# Patient Record
Sex: Female | Born: 1965 | Race: White | Hispanic: No | Marital: Married | Smoking: Never smoker
Health system: Southern US, Community
[De-identification: ages and names within clinical notes are randomized; demographics above are authoritative.]

## PROBLEM LIST (undated history)

## (undated) DIAGNOSIS — H8109 Meniere's disease, unspecified ear: Secondary | ICD-10-CM

## (undated) DIAGNOSIS — E7800 Pure hypercholesterolemia, unspecified: Secondary | ICD-10-CM

## (undated) DIAGNOSIS — K589 Irritable bowel syndrome without diarrhea: Secondary | ICD-10-CM

## (undated) DIAGNOSIS — T4145XA Adverse effect of unspecified anesthetic, initial encounter: Secondary | ICD-10-CM

## (undated) DIAGNOSIS — K5790 Diverticulosis of intestine, part unspecified, without perforation or abscess without bleeding: Secondary | ICD-10-CM

## (undated) DIAGNOSIS — J309 Allergic rhinitis, unspecified: Secondary | ICD-10-CM

## (undated) DIAGNOSIS — K219 Gastro-esophageal reflux disease without esophagitis: Secondary | ICD-10-CM

## (undated) DIAGNOSIS — N951 Menopausal and female climacteric states: Secondary | ICD-10-CM

## (undated) DIAGNOSIS — R112 Nausea with vomiting, unspecified: Secondary | ICD-10-CM

## (undated) DIAGNOSIS — T8859XA Other complications of anesthesia, initial encounter: Secondary | ICD-10-CM

## (undated) DIAGNOSIS — I499 Cardiac arrhythmia, unspecified: Secondary | ICD-10-CM

## (undated) DIAGNOSIS — Z9889 Other specified postprocedural states: Secondary | ICD-10-CM

## (undated) DIAGNOSIS — Z8719 Personal history of other diseases of the digestive system: Secondary | ICD-10-CM

## (undated) DIAGNOSIS — J45909 Unspecified asthma, uncomplicated: Secondary | ICD-10-CM

## (undated) DIAGNOSIS — M1990 Unspecified osteoarthritis, unspecified site: Secondary | ICD-10-CM

## (undated) HISTORY — DX: Allergic rhinitis, unspecified: J30.9

## (undated) HISTORY — PX: ESOPHAGEAL DILATION: SHX303

## (undated) HISTORY — PX: COLONOSCOPY: SHX174

## (undated) HISTORY — DX: Gastro-esophageal reflux disease without esophagitis: K21.9

## (undated) HISTORY — DX: Unspecified asthma, uncomplicated: J45.909

## (undated) HISTORY — PX: KNEE ARTHROSCOPY: SUR90

## (undated) HISTORY — DX: Diverticulosis of intestine, part unspecified, without perforation or abscess without bleeding: K57.90

## (undated) HISTORY — DX: Irritable bowel syndrome without diarrhea: K58.9

## (undated) HISTORY — DX: Menopausal and female climacteric states: N95.1

---
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
## 1984-03-03 HISTORY — PX: APPENDECTOMY: SHX54

## 2007-03-04 HISTORY — PX: ENDOMETRIAL ABLATION: SHX621

## 2007-03-04 HISTORY — PX: CERVICAL FUSION: SHX112

## 2009-03-03 HISTORY — PX: URETHRAL SLING: SHX2621

## 2010-03-03 HISTORY — PX: ABDOMINAL HYSTERECTOMY: SHX81

## 2010-03-03 LAB — HM PAP SMEAR: HM PAP: NEGATIVE

## 2011-03-04 HISTORY — PX: BREAST BIOPSY: SHX20

## 2011-03-04 HISTORY — PX: HEMORRHOID BANDING: SHX5850

## 2014-01-31 LAB — HM MAMMOGRAPHY: HM Mammogram: NEGATIVE

## 2014-08-14 DIAGNOSIS — N951 Menopausal and female climacteric states: Secondary | ICD-10-CM

## 2014-08-14 DIAGNOSIS — K219 Gastro-esophageal reflux disease without esophagitis: Secondary | ICD-10-CM

## 2014-08-15 ENCOUNTER — Ambulatory Visit (INDEPENDENT_AMBULATORY_CARE_PROVIDER_SITE_OTHER): Payer: Managed Care, Other (non HMO) | Admitting: Obstetrics and Gynecology

## 2014-08-15 ENCOUNTER — Encounter: Payer: Self-pay | Admitting: Obstetrics and Gynecology

## 2014-08-15 VITALS — BP 114/75 | HR 77 | Ht 65.0 in | Wt 214.3 lb

## 2014-08-15 DIAGNOSIS — Z79890 Hormone replacement therapy (postmenopausal): Secondary | ICD-10-CM

## 2014-08-15 DIAGNOSIS — Z01419 Encounter for gynecological examination (general) (routine) without abnormal findings: Secondary | ICD-10-CM | POA: Diagnosis not present

## 2014-08-15 DIAGNOSIS — N951 Menopausal and female climacteric states: Secondary | ICD-10-CM

## 2014-08-15 DIAGNOSIS — K589 Irritable bowel syndrome without diarrhea: Secondary | ICD-10-CM | POA: Insufficient documentation

## 2014-08-15 NOTE — Patient Instructions (Addendum)
Patient to schedule Mammogram.  Begin weaning estrogen tablets from daily to every other day (x 2 weeks), then down to twice weekly (x 2 weeks), then once weekly (x 2 weeks), then completely off medication.  Follow up if symptoms worsen.

## 2014-08-15 NOTE — Progress Notes (Signed)
Subjective:    Teresa Guerrero is a 49 y.o. P71 female who presents to establish care and for annual exam. The patient has no complaints today. The patient is sexually active. GYN screening history: last pap: approximate date 2012 and was normal and last mammogram: approximate date 2015 and was normal. Last colonoscopy 2 years ago and was normal. The patient is taking hormone replacement therapy. Patient denies post-menopausal vaginal bleeding.. The patient wears seatbelts: yes. The patient participates in regular exercise: yes, has recently initiated. Has the patient ever been transfused or tattooed?: no. The patient reports that there is not domestic violence in her life.    Menstrual History: OB History    Gravida Para Term Preterm AB TAB SAB Ectopic Multiple Living   1 1 1       1   SVD    Menarche age: 59 Patient's last menstrual period was 03/03/2010.  Denies h/o STIs or abnormal pap smears.  Past Medical History  Diagnosis Date  . GERD (gastroesophageal reflux disease)   . Climacteric syndrome   . Irritable Bowel Syndrome    Family History  Problem Relation Age of Onset  . Diabetes Mother   . Thyroid disease Mother   . Hypertension Mother   . Heart disease Mother   . Hypertension Father   . Stroke Father   . Heart disease Father   . Colon cancer Brother    Past Surgical History  Procedure Laterality Date  . Cesarean section  1994  . Abdominal hysterectomy  2012    Menorrhagia, No cervix, No ovaries  . Breast biopsy Left 2013    normal results  . Endometrial ablation  2009   Current Outpatient Prescriptions on File Prior to Visit  Medication Sig Dispense Refill  . aspirin 81 MG tablet Take 81 mg by mouth daily.    . cycloSPORINE (RESTASIS) 0.05 % ophthalmic emulsion 1 drop 2 (two) times daily.    Marland Kitchen desloratadine (CLARINEX) 5 MG tablet Take 5 mg by mouth as needed.    Marland Kitchen estropipate (OGEN) 0.75 MG tablet Take 0.75 mg by mouth daily.    . Multiple Vitamin (MULTIVITAMIN)  tablet Take 1 tablet by mouth daily.    . Omega-3 Fatty Acids (FISH OIL BURP-LESS PO) Take by mouth.    . pantoprazole (PROTONIX) 40 MG tablet Take 40 mg by mouth daily.    . vitamin C (ASCORBIC ACID) 250 MG tablet Take 250 mg by mouth daily.     No current facility-administered medications on file prior to visit.   Allergies  Allergen Reactions  . Formaldehyde Hives  . Kefzol [Cefazolin]   . Penicillins     Review of Systems  Constitutional: negative for chills, fatigue, fevers and sweats Eyes: negative for irritation, redness and visual disturbance Ears, nose, mouth, throat, and face: negative for hearing loss, nasal congestion, snoring and tinnitus Respiratory: negative for asthma, cough, sputum Cardiovascular: negative for chest pain, dyspnea, exertional chest pressure/discomfort, irregular heart beat, palpitations and syncope Gastrointestinal: negative for abdominal pain, change in bowel habits, nausea and vomiting Genitourinary: positive for  sexual problems (vaginal dryness); negative for genital lesions,vaginal discharge, dysuria and urinary incontinence Integument/breast: negative for breast lump, breast tenderness and nipple discharge Hematologic/lymphatic: negative for bleeding and easy bruising Musculoskeletal:negative for back pain and muscle weakness Neurological: negative for dizziness, headaches, vertigo and weakness Endocrine: negative for diabetic symptoms including polydipsia, polyuria and skin dryness Allergic/Immunologic: negative for hay fever and urticaria    Objective:    BP  114/75 mmHg  Pulse 77  Ht  (1.651 m)  Wt 214 lb 4.8 oz (97.206 kg)  BMI 35.66 kg/m2  LMP 03/03/2010  General Appearance:    Alert, cooperative, no distress, appears stated age  Head:    Normocephalic, without obvious abnormality, atraumatic  Eyes:    PERRL, conjunctiva/corneas clear, EOM's intact, fundi    benign, both eyes  Ears:    Normal TM's and external ear canals, both  ears  Nose:   Nares normal, septum midline, mucosa normal, no drainage    or sinus tenderness  Throat:   Lips, mucosa, and tongue normal; teeth and gums normal  Neck:   Supple, symmetrical, trachea midline, no adenopathy;    thyroid:  no enlargement/tenderness/nodules; no carotid   bruit or JVD  Back:     Symmetric, no curvature, ROM normal, no CVA tenderness  Lungs:     Clear to auscultation bilaterally, respirations unlabored  Chest Wall:    No tenderness or deformity   Heart:    Regular rate and rhythm, S1 and S2 normal, no murmur, rub   or gallop  Breast Exam:    No tenderness, masses, or nipple abnormality  Abdomen:     Soft, non-tender, bowel sounds active all four quadrants,    no masses, no organomegaly  Genitalia:    Normal female external genitalia without lesion.  Vagina with small amount of thin white discharge. nderness  Rectal:    Normal tone, normal prostate, no masses or tenderness;   guaiac negative stool  Extremities:   Extremities normal, atraumatic, no cyanosis or edema  Pulses:   2+ and symmetric all extremities  Skin:   Skin color, texture, turgor normal, no rashes or lesions  Lymph nodes:   Cervical, supraclavicular, and axillary nodes normal  Neurologic:   CNII-XII intact, normal strength, sensation and reflexes    throughout      Assessment:    Normal gyn exam Hormone replacement therapy Menopause    Plan:    Breast self exam technique reviewed and patient encouraged to perform self-exam monthly.  Mammogram to be scheduled. Discussed healthy lifestyle modifications. Discussion had on HRT.  Patient wonders if she still needs it.  Was initiated immediately after hysterectomy in 2012.  Patient counseled on recommended use of HRT for the shortest time frame necessary, on the smallest dose.  Counseled patient on continued use vs weaning off HRT.  Patient desires to wean if possible as she is not sure if she still needs it.  Advised to decrease to 1 tablet every  other day x 1 week, then decrease to twice weekly, and so on until off HRT.  If vasomotor symptoms occur, can discuss further management of returning to HRT or trial with non hormonal regimen.  Discussed vaginal dryness symptoms. Recommend use of lubricants during intercourse as needed.  Follow up in 6-8 weeks to discusss HRT and menopausal symptoms (if present) . Follow up in 1 year for annual exam.    Hildred Laser, MD [facility] 08/15/2014 8:04 PM

## 2014-10-13 ENCOUNTER — Ambulatory Visit
Admission: RE | Admit: 2014-10-13 | Discharge: 2014-10-13 | Disposition: A | Discharge status: Home / Self Care | Payer: Managed Care, Other (non HMO) | Source: Ambulatory Visit | Attending: Obstetrics and Gynecology | Admitting: Obstetrics and Gynecology

## 2014-10-13 DIAGNOSIS — Z01419 Encounter for gynecological examination (general) (routine) without abnormal findings: Secondary | ICD-10-CM | POA: Diagnosis present

## 2014-10-13 DIAGNOSIS — Z1231 Encounter for screening mammogram for malignant neoplasm of breast: Secondary | ICD-10-CM | POA: Insufficient documentation

## 2014-10-17 ENCOUNTER — Encounter: Payer: Self-pay | Admitting: Obstetrics and Gynecology

## 2014-10-17 ENCOUNTER — Ambulatory Visit (INDEPENDENT_AMBULATORY_CARE_PROVIDER_SITE_OTHER): Payer: Managed Care, Other (non HMO) | Admitting: Obstetrics and Gynecology

## 2014-10-17 VITALS — BP 130/80 | HR 84 | Temp 98.8°F | Resp 16 | Ht 65.0 in | Wt 214.0 lb

## 2014-10-17 DIAGNOSIS — Z9229 Personal history of other drug therapy: Secondary | ICD-10-CM

## 2014-10-17 DIAGNOSIS — N951 Menopausal and female climacteric states: Secondary | ICD-10-CM | POA: Diagnosis not present

## 2014-10-17 NOTE — Progress Notes (Signed)
Subjective:    Teresa Guerrero is a 49 y.o. G21P1001 female who presents to discuss cessation hormone replacement therapy. Patient has been on hormone replacement therapy since 2012 after complete hysterectomy, and desired to attempt to discontinue due to concerns for possibility of developing cancer. Has been weaning off therapy over the past 2 months. The patient is not currently taking hormone replacement therapy. Patient denies post-menopausal vaginal bleeding. The patient is sexually active. The patient currently has symptoms of hot flashes (mild, occuring 2-3 times daily). Has been using peppermint oil and conservative management (light clothing, consuming cold fluids) for current management.   Current hormone therapy:  None  Previous hormone therapy:  Estrogen: Ogen 0.75 mg daily Reason for starting HRT: hysterectomy with BSO Bleeding in past on HRT: amenorrhea Patient's last menstrual period was 03/03/2010.    The following portions of the patient's history were reviewed and updated as appropriate: allergies, current medications, past family history, past medical history, past social history, past surgical history and problem list.  Review of Systems Pertinent items are noted in HPI.    Objective:     BP 130/80 mmHg  Pulse 84  Temp(Src) 98.8 F (37.1 C) (Oral)  Resp 16  Ht  (1.651 m)  Wt 214 lb (97.07 kg)  BMI 35.61 kg/m2  LMP 03/03/2010 No exam performed today    Assessment:   Postmenopausal female Mild vasomotor symptoms Discontinued HRT (patient preference)   Plan:   Patient to continue using OTC remedies (peppermint oil) as well as conservative lifestyle management.   Given list of other OTC and herbal remedies.  Also discussed other non-hormonal therapies for management of hot flashes (including Brisdelle, Clonidin, Neurontin).  Patient to f/u if symptoms worsen and desires to try prescription medications.

## 2014-11-14 ENCOUNTER — Ambulatory Visit: Payer: Managed Care, Other (non HMO) | Admitting: Family Medicine

## 2014-11-15 ENCOUNTER — Telehealth: Payer: Self-pay | Admitting: Family Medicine

## 2014-11-15 MED ORDER — PANTOPRAZOLE SODIUM 40 MG PO TBEC: 40.0000 mg | DELAYED_RELEASE_TABLET | Freq: Every day | ORAL | Status: DC

## 2014-11-15 NOTE — Telephone Encounter (Signed)
Pt states she is waiting on a call back about her prescription. She needs refills on Pantoprazole to be called into [facility]. Pt has enough to last the rest of this week.

## 2014-11-15 NOTE — Telephone Encounter (Signed)
Medication has been refilled and sent to [facility] 

## 2014-12-12 ENCOUNTER — Other Ambulatory Visit: Payer: Self-pay | Admitting: Family Medicine

## 2014-12-16 ENCOUNTER — Other Ambulatory Visit: Payer: Self-pay | Admitting: Family Medicine

## 2014-12-22 ENCOUNTER — Ambulatory Visit (INDEPENDENT_AMBULATORY_CARE_PROVIDER_SITE_OTHER): Payer: Managed Care, Other (non HMO) | Admitting: Family Medicine

## 2014-12-22 ENCOUNTER — Encounter: Payer: Self-pay | Admitting: Family Medicine

## 2014-12-22 VITALS — BP 132/70 | HR 80 | Temp 98.4°F | Resp 19 | Ht 65.0 in | Wt 210.9 lb

## 2014-12-22 DIAGNOSIS — K219 Gastro-esophageal reflux disease without esophagitis: Secondary | ICD-10-CM | POA: Diagnosis not present

## 2014-12-22 DIAGNOSIS — Z23 Encounter for immunization: Secondary | ICD-10-CM

## 2014-12-22 MED ORDER — PANTOPRAZOLE SODIUM 40 MG PO TBEC: 40.0000 mg | DELAYED_RELEASE_TABLET | Freq: Every day | ORAL | Status: DC

## 2014-12-22 NOTE — Progress Notes (Signed)
Name: Teresa Guerrero   MRN: [id number]    DOB: 11-14-1965   Date:12/22/2014       Progress Note  Subjective  Chief Complaint  Chief Complaint  Patient presents with  . Medication Refill    Pantoprazole   . Gastroesophageal Reflux    Gastroesophageal Reflux She reports no abdominal pain, no coughing, no dysphagia, no heartburn, no nausea or no sore throat. The symptoms are aggravated by certain foods (greasy and spicy foods and eating late.). Risk factors include hiatal hernia. She has tried a PPI for the symptoms. Past procedures include an EGD.    Past Medical History  Diagnosis Date  . GERD (gastroesophageal reflux disease)   . Climacteric syndrome     Past Surgical History  Procedure Laterality Date  . Cesarean section  1994  . Abdominal hysterectomy  2012    Menorrhagia, No cervix, No ovaries  . Endometrial ablation  2009  . Breast biopsy Left 2013    normal results    Family History  Problem Relation Age of Onset  . Diabetes Mother   . Thyroid disease Mother   . Hypertension Mother   . Heart disease Mother   . Hypertension Father   . Stroke Father   . Heart disease Father   . Colon cancer Brother     Social History   Social History  . Marital Status: Married    Spouse Name: N/A  . Number of Children: N/A  . Years of Education: N/A   Occupational History  . Not on file.   Social History Main Topics  . Smoking status: Never Smoker   . Smokeless tobacco: Never Used  . Alcohol Use: 4.8 oz/week    8 Glasses of wine per week  . Drug Use: No  . Sexual Activity: Yes    Birth Control/ Protection: Surgical   Other Topics Concern  . Not on file   Social History Narrative     Current outpatient prescriptions:  .  aspirin 81 MG tablet, Take 81 mg by mouth daily., Disp: , Rfl:  .  cycloSPORINE (RESTASIS) 0.05 % ophthalmic emulsion, 1 drop 2 (two) times daily., Disp: , Rfl:  .  desloratadine (CLARINEX) 5 MG tablet, Take 5 mg by mouth as needed., Disp:  , Rfl:  .  Multiple Vitamin (MULTIVITAMIN) tablet, Take 1 tablet by mouth daily., Disp: , Rfl:  .  Omega-3 Fatty Acids (FISH OIL BURP-LESS PO), Take by mouth., Disp: , Rfl:  .  pantoprazole (PROTONIX) 40 MG tablet, Take 1 tablet (40 mg total) by mouth daily., Disp: 30 tablet, Rfl: 0 .  vitamin C (ASCORBIC ACID) 250 MG tablet, Take 250 mg by mouth daily., Disp: , Rfl:   Allergies  Allergen Reactions  . Kefzol [Cefazolin] Anaphylaxis and Cough  . Penicillin G Anaphylaxis  . Penicillins Anaphylaxis  . Prednisone Anaphylaxis  . Azithromycin Rash  . Ciprofloxacin Rash  . Formaldehyde Hives and Rash   Review of Systems  HENT: Negative for sore throat.   Respiratory: Negative for cough.   Gastrointestinal: Negative for heartburn, dysphagia, nausea, vomiting, abdominal pain, diarrhea and blood in stool.    Objective  Filed Vitals:   12/22/14 1000  BP: 132/70  Pulse: 80  Temp: 98.4 F (36.9 C)  TempSrc: Oral  Resp: 19  Height:  (1.651 m)  Weight: 210 lb 14.4 oz (95.664 kg)  SpO2: 97%    Physical Exam  Constitutional: She is oriented to person, place, and time and well-developed,  well-nourished, and in no distress.  Cardiovascular: Normal rate and regular rhythm.   No murmur heard. Pulmonary/Chest: Effort normal and breath sounds normal. She has no wheezes. She has no rales.  Abdominal: Soft. Bowel sounds are normal. She exhibits no distension. There is no tenderness.  Neurological: She is alert and oriented to person, place, and time.  Nursing note and vitals reviewed.  Assessment & Plan  1. Need for immunization against influenza  - Flu Vaccine QUAD 36+ mos PF IM (Fluarix & Fluzone Quad PF)  2. Gastroesophageal reflux disease, esophagitis presence not specified  Discussed side effects of long-term PPI therapy including possible renal impairment according to some new research studies. We'll obtain labs today. Refills provided.  - Comprehensive Metabolic Panel  (CMET) - pantoprazole (PROTONIX) 40 MG tablet; Take 1 tablet (40 mg total) by mouth daily.  Dispense: 90 tablet; Refill: 3    Teresa Guerrero Asad A. Faylene KurtzShah [facility] 12/22/2014 10:25 AM

## 2015-04-26 ENCOUNTER — Encounter: Payer: Self-pay | Admitting: Obstetrics and Gynecology

## 2015-04-26 ENCOUNTER — Ambulatory Visit (INDEPENDENT_AMBULATORY_CARE_PROVIDER_SITE_OTHER): Payer: Managed Care, Other (non HMO) | Admitting: Obstetrics and Gynecology

## 2015-04-26 VITALS — BP 129/79 | HR 101 | Ht 65.0 in | Wt 215.5 lb

## 2015-04-26 DIAGNOSIS — L0231 Cutaneous abscess of buttock: Secondary | ICD-10-CM

## 2015-04-26 MED ORDER — SULFAMETHOXAZOLE-TRIMETHOPRIM 800-160 MG PO TABS: 1.0000 | ORAL_TABLET | Freq: Two times a day (BID) | ORAL | Status: DC

## 2015-04-26 MED ORDER — LIDOCAINE HCL 2 % EX GEL: 1.0000 "application " | CUTANEOUS | Status: DC | PRN

## 2015-04-26 NOTE — Progress Notes (Signed)
    GYNECOLOGY PROGRESS NOTE  Subjective:    Patient ID: Teresa Guerrero, female    DOB: [birth year], 50 y.o.   MRN: [id number]  HPI  Patient is a 50 y.o. G79P1001 female who presents for complaints of possible boil on left buttock.  Patient notes that she was out of town several days ago when she recognized something tender and swollen on left bettock. Symptoms have gradually worsened. States that she had a friend who was a nurse call in an antibiotic (Clindamycin).  Just started antibiotic yesterday.  Denies fevers, chills. Does note some drainage coming from the area.   The following portions of the patient's history were reviewed and updated as appropriate: allergies, current medications, past family history, past medical history, past social history, past surgical history and problem list.  Review of Systems Pertinent items noted in HPI and remainder of comprehensive ROS otherwise negative.   Objective:   Blood pressure 129/79, pulse 101, height  (1.651 m), weight 215 lb 8 oz (97.75 kg), last menstrual period 03/03/2010. General appearance: alert and mild distress Abdomen: soft, non-tender; bowel sounds normal; no masses,  no organomegaly Pelvic: deferred  Buttock: There is an area characterized by a subcutaneous mass consistent with a cutaneous abscess, erythema surrounding area measuring 4 cm, induration, fluctuance, tenderness, purulent drainage measuring 4 cm in greatest dimension. Location: left buttock ~ 2 cm from anus. Extremities: extremities normal, atraumatic, no cyanosis or edema Neurologic: Grossly normal   Assessment:   Cutaneous abscess.   Plan:   Apply hot compresses/sitz baths frequently to continue to promote drainage. Reassured that this represents a benign process. Culture of abscess fluid obtained.  Oral antibiotics -- see med orders.  Patient with several drug allergies.  Area injected with ~ 3 cc of lidocaine without epinephrine for comfort.  Will also  prescribe lidocaine gel to apply over abscess for 1-2 days.  RTC in 4 days for possible I&D, or f/u at urgent care/Emergency Room if symptoms worsen over the weekend.   Hildred Laser, MD [facility]

## 2015-04-30 ENCOUNTER — Encounter: Payer: Self-pay | Admitting: Obstetrics and Gynecology

## 2015-04-30 ENCOUNTER — Ambulatory Visit (INDEPENDENT_AMBULATORY_CARE_PROVIDER_SITE_OTHER): Payer: Managed Care, Other (non HMO) | Admitting: Obstetrics and Gynecology

## 2015-04-30 VITALS — BP 108/73 | HR 76 | Ht 65.0 in | Wt 214.8 lb

## 2015-04-30 DIAGNOSIS — L0231 Cutaneous abscess of buttock: Secondary | ICD-10-CM

## 2015-04-30 NOTE — Progress Notes (Signed)
   GYNECOLOGY CLINIC PROGRESS NOTE  Subjective:     Teresa Guerrero is a 50 y.o. G84P1001 female who presents for follow up of a left buttock cutaneous abscess. Onset was 5 days ago. Symptoms have gradually improved with antibiotics and sitz baths.  Notes no further drainage from abscess after Saturday (2 days ago).   The following portions of the patient's history were reviewed and updated as appropriate: allergies, current medications, past family history, past medical history, past social history, past surgical history and problem list.    Objective:  Blood pressure 108/73, pulse 76, height  (1.651 m), weight 214 lb 12.8 oz (97.433 kg), last menstrual period 03/03/2010. Gen App: NAD  Left buttock: There is an area characterized by marked decrease in erythema compared to last visit, small 1 x 2 cm abscess palpable, with scant drainage (yellow, watery).    Assessment:     Cutaneous abscess, improved.    Plan:    Apply hot compresses frequently to promote drainage. Reassured that this represents a benign process. Continue antibiotics (Bactrim DS) until course completed.  RTC in 1 week or PRN.    Hildred Laser, MD [facility]

## 2015-05-01 LAB — ANAEROBIC AND AEROBIC CULTURE

## 2015-05-09 ENCOUNTER — Ambulatory Visit (INDEPENDENT_AMBULATORY_CARE_PROVIDER_SITE_OTHER): Payer: Managed Care, Other (non HMO) | Admitting: Obstetrics and Gynecology

## 2015-05-09 ENCOUNTER — Encounter: Payer: Self-pay | Admitting: Obstetrics and Gynecology

## 2015-05-09 VITALS — BP 110/73 | HR 86 | Ht 65.0 in | Wt 215.9 lb

## 2015-05-09 DIAGNOSIS — L0231 Cutaneous abscess of buttock: Secondary | ICD-10-CM | POA: Diagnosis not present

## 2015-05-09 NOTE — Progress Notes (Signed)
   GYNECOLOGY CLINIC PROGRESS NOTE  Subjective:     Teresa Guerrero is a 50 y.o. 841P1001 female who presents for follow up of a left buttock cutaneous abscess. Symptoms have essentially resolved with antibiotics and sitz baths.  Notes no further drainage.  The following portions of the patient's history were reviewed and updated as appropriate: allergies, current medications, past family history, past medical history, past social history, past surgical history and problem list.    Objective:  Blood pressure 110/73, pulse 86, height 5\' 5"  (1.651 m), weight 215 lb 14.4 oz (97.932 kg), last menstrual period 03/03/2010. Gen App: NAD  Left buttock:  No erythema, fluctuance, or induration present.  Subcutaneous abscess resolved.    Assessment:     Cutaneous abscess of left buttock, resolved .    Plan:   Discussed prevention measures for future abscesses in the area, including: gently washing the area well with soapy water every day, using an antibacterial soap, always drying the area well, and not wearing tight clothing over the area. Follow up as needed.    Hildred LaserAnika Dilan Novosad, MD [facility]

## 2015-05-23 ENCOUNTER — Encounter: Payer: Self-pay | Admitting: Obstetrics and Gynecology

## 2015-06-08 ENCOUNTER — Emergency Department
Admission: EM | Admit: 2015-06-08 | Discharge: 2015-06-09 | Disposition: A | Discharge status: Home / Self Care | Payer: Managed Care, Other (non HMO) | Attending: Emergency Medicine | Admitting: Emergency Medicine

## 2015-06-08 ENCOUNTER — Emergency Department: Payer: Managed Care, Other (non HMO)

## 2015-06-08 DIAGNOSIS — R002 Palpitations: Secondary | ICD-10-CM | POA: Diagnosis not present

## 2015-06-08 LAB — BASIC METABOLIC PANEL
ANION GAP: 8 (ref 5–15)
BUN: 19 mg/dL (ref 6–20)
CHLORIDE: 102 mmol/L (ref 101–111)
CO2: 25 mmol/L (ref 22–32)
Calcium: 9.7 mg/dL (ref 8.9–10.3)
Creatinine, Ser: 0.9 mg/dL (ref 0.44–1.00)
GFR calc non Af Amer: >60 mL/min (ref >60)
Glucose, Bld: 107 mg/dL — ABNORMAL HIGH (ref 65–99)
POTASSIUM: 3.4 mmol/L — AB (ref 3.5–5.1)
Sodium: 135 mmol/L (ref 135–145)

## 2015-06-08 LAB — CBC
HCT: 44.2 % (ref 35.0–47.0)
HEMOGLOBIN: 15.2 g/dL (ref 12.0–16.0)
MCH: 31.1 pg (ref 26.0–34.0)
MCHC: 34.3 g/dL (ref 32.0–36.0)
MCV: 90.7 fL (ref 80.0–100.0)
Platelets: 250 10*3/uL (ref 150–440)
RBC: 4.88 MIL/uL (ref 3.80–5.20)
RDW: 13.9 % (ref 11.5–14.5)
WBC: 9.5 10*3/uL (ref 3.6–11.0)

## 2015-06-08 LAB — TROPONIN I

## 2015-06-08 NOTE — ED Provider Notes (Signed)
[facility] Emergency Department Provider Note  ____________________________________________  Time seen: Approximately 11:33 PM  I have reviewed the triage vital signs and the nursing notes.   HISTORY  Chief Complaint Palpitations and Chest Pain    HPI Teresa Guerrero is a 50 y.o. female who reports a past medical history of "benign tachycardia" who presents with intermittent episodes of palpitations and "fluttering" in her chest.  She has been having this chronically but it has been rare until the last couple of weeks where she has felt more and more often.  She said that sometimes when the symptoms are stronger she feels some mild shortness of breath and dull aching in her chest but that once the feeling of tachycardia is gone then the other symptoms resolved as well.  She notes that sometimes holding her breath during the episodes makes it feel better.  She has scheduled a new patient appointment with Dr. Mariah Milling in about a month but she became concerned because earlier today she felt the episode again and she wanted to make sure she was "not having a heart attack".  The patient denies fever/chills, abdominal pain, nausea, vomiting, diarrhea.  She states that the symptoms are moderate when they are occurring.  Currently she is symptom-free.  The patient reports that she drinks a lot of diet coke with caffeine but has not had any other new dietary changes or supplements that might be contributing to her symptoms.   Past Medical History  Diagnosis Date  . GERD (gastroesophageal reflux disease)   . Climacteric syndrome     Patient Active Problem List   Diagnosis Date Noted  . Menopausal syndrome (hot flashes) 10/17/2014  . History of postmenopausal HRT 10/17/2014  . Irritable bowel syndrome (IBS) 08/15/2014  . GERD (gastroesophageal reflux disease) 08/14/2014  . Climacteric syndrome 08/14/2014    Past Surgical History  Procedure Laterality Date  . Cesarean  section  1994  . Abdominal hysterectomy  2012    Menorrhagia, No cervix, No ovaries  . Endometrial ablation  2009  . Breast biopsy Left 2013    normal results    Current Outpatient Rx  Name  Route  Sig  Dispense  Refill  . aspirin 81 MG tablet   Oral   Take 81 mg by mouth daily.         . cycloSPORINE (RESTASIS) 0.05 % ophthalmic emulsion      1 drop 2 (two) times daily.         Marland Kitchen desloratadine (CLARINEX) 5 MG tablet   Oral   Take 5 mg by mouth as needed.         . lidocaine (XYLOCAINE) 2 % jelly   Topical   Apply 1 application topically as needed.   30 mL   2   . Multiple Vitamin (MULTIVITAMIN) tablet   Oral   Take 1 tablet by mouth daily.         . Omega-3 Fatty Acids (FISH OIL BURP-LESS PO)   Oral   Take by mouth.         . pantoprazole (PROTONIX) 40 MG tablet   Oral   Take 1 tablet (40 mg total) by mouth daily.   90 tablet   3   . sulfamethoxazole-trimethoprim (BACTRIM DS,SEPTRA DS) 800-160 MG tablet   Oral   Take 1 tablet by mouth 2 (two) times daily.   14 tablet   0   . vitamin C (ASCORBIC ACID) 250 MG tablet   Oral  Take 250 mg by mouth daily.           Allergies Kefzol; Penicillin g; Penicillins; Prednisone; Azithromycin; Ciprofloxacin; and Formaldehyde  Family History  Problem Relation Age of Onset  . Diabetes Mother   . Thyroid disease Mother   . Hypertension Mother   . Heart disease Mother   . Hypertension Father   . Stroke Father   . Heart disease Father   . Colon cancer Brother     Social History Social History  Substance Use Topics  . Smoking status: Never Smoker   . Smokeless tobacco: Never Used  . Alcohol Use: 4.8 oz/week    8 Glasses of wine per week    Review of Systems Constitutional: No fever/chills Eyes: No visual changes. ENT: No sore throat. Cardiovascular: Occasional episodes of arrhythmia and fluttering with associated mild shortness of breath and occasional mild dull aching chest  pain Respiratory: Denies shortness of breath. Gastrointestinal: No abdominal pain.  No nausea, no vomiting.  No diarrhea.  No constipation. Genitourinary: Negative for dysuria. Musculoskeletal: Negative for back pain. Skin: Negative for rash. Neurological: Negative for headaches, focal weakness or numbness.  10-point ROS otherwise negative.  ____________________________________________   PHYSICAL EXAM:  VITAL SIGNS: ED Triage Vitals  Enc Vitals Group     BP 06/08/15 2116 147/87 mmHg     Pulse Rate 06/08/15 2116 89     Resp 06/08/15 2116 18     Temp 06/08/15 2116 97.8 F (36.6 C)     Temp Source 06/08/15 2116 Oral     SpO2 06/08/15 2116 100 %     Weight 06/08/15 2116 212 lb (96.163 kg)     Height 06/08/15 2116  (1.651 m)     Head Cir --      Peak Flow --      Pain Score 06/08/15 2117 5     Pain Loc --      Pain Edu? --      Excl. in GC? --     Constitutional: Alert and oriented. Well appearing and in no acute distress. Eyes: Conjunctivae are normal. PERRL. EOMI. Head: Atraumatic. Nose: No congestion/rhinnorhea. Mouth/Throat: Mucous membranes are moist.  Oropharynx non-erythematous. Neck: No stridor.  No meningeal signs.   Cardiovascular: Normal rate, regular rhythm. Good peripheral circulation. Grossly normal heart sounds.   Respiratory: Normal respiratory effort.  No retractions. Lungs CTAB. Gastrointestinal: Soft and nontender. No distention.  Musculoskeletal: No lower extremity tenderness nor edema. No gross deformities of extremities. Neurologic:  Normal speech and language. No gross focal neurologic deficits are appreciated.  Skin:  Skin is warm, dry and intact. No rash noted. Psychiatric: Mood and affect are normal. Speech and behavior are normal.  ____________________________________________   LABS (all labs ordered are listed, but only abnormal results are displayed)  Labs Reviewed  BASIC METABOLIC PANEL - Abnormal; Notable for the following:     Potassium 3.4 (*)    Glucose, Bld 107 (*)    All other components within normal limits  CBC  TROPONIN I  TSH  T4, FREE   ____________________________________________  EKG  ED ECG REPORT I, Andrea Colglazier, the attending physician, personally viewed and interpreted this ECG.  Date: 06/08/2015 EKG Time: 21:08 Rate: 76 Rhythm: normal sinus rhythm QRS Axis: normal Intervals: normal ST/T Wave abnormalities: normal Conduction Disturbances: none Narrative Interpretation: unremarkable  ____________________________________________  RADIOLOGY   Dg Chest 2 View  06/08/2015  CLINICAL DATA:  Tachycardia, increasing in frequency over the past few weeks.  EXAM: CHEST  2 VIEW COMPARISON:  None. FINDINGS: The heart size and mediastinal contours are within normal limits. Both lungs are clear. The visualized skeletal structures are unremarkable. IMPRESSION: No active cardiopulmonary disease. Electronically Signed   By: Ellery Plunkaniel R Mitchell M.D.   On: 06/08/2015 21:44    ____________________________________________   PROCEDURES  Procedure(s) performed: None  Critical Care performed: No ____________________________________________   INITIAL IMPRESSION / ASSESSMENT AND PLAN / ED COURSE  Pertinent labs & imaging results that were available during my care of the patient were reviewed by me and considered in my medical decision making (see chart for details).  The patient is well-appearing and in no acute distress with a normal EKG and normal labs.  Her episodes are most consistent with PSVT, although it is not possible to determine this without eating the arrhythmia on the monitor or on the EKG.  I had my usual customary discussion with her about cardiac arrhythmias and specifically PSVT.  I will send a message to [facility] to see if it is possible for her to be seen sooner and I gave her my usual and customary return precautions.  She understands and agrees with the  plan.  ____________________________________________  FINAL CLINICAL IMPRESSION(S) / ED DIAGNOSES  Final diagnoses:  Heart palpitations      NEW MEDICATIONS STARTED DURING THIS VISIT:  New Prescriptions   No medications on file      Note:  This document was prepared using Dragon voice recognition software and may include unintentional dictation errors.   Loleta Roseory Messi Twedt, MD 06/08/15 [phone]

## 2015-06-08 NOTE — Discharge Instructions (Signed)
As we discussed, your signs and symptoms strongly and intermittent but benign heart arrhythmia, possibly such as paroxysmal supraventricular tachycardia (PSVT).  This is a generally benign condition, but we recommend you follow up with the listed cardiologist for further evaluation.  Please read through the included information and try some of the techniques we discussed the next time you have the symptoms.  Return to the Emergency Department if you develop new or worsening symptoms that concern you.   Palpitations A palpitation is the feeling that your heartbeat is irregular or is faster than normal. It may feel like your heart is fluttering or skipping a beat. Palpitations are usually not a serious problem. However, in some cases, you may need further medical evaluation. CAUSES  Palpitations can be caused by:  Smoking.  Caffeine or other stimulants, such as diet pills or energy drinks.  Alcohol.  Stress and anxiety.  Strenuous physical activity.  Fatigue.  Certain medicines.  Heart disease, especially if you have a history of irregular heart rhythms (arrhythmias), such as atrial fibrillation, atrial flutter, or supraventricular tachycardia.  An improperly working pacemaker or defibrillator. DIAGNOSIS  To find the cause of your palpitations, your health care provider will take your medical history and perform a physical exam. Your health care provider may also have you take a test called an ambulatory electrocardiogram (ECG). An ECG records your heartbeat patterns over a 24-hour period. You may also have other tests, such as:  Transthoracic echocardiogram (TTE). During echocardiography, sound waves are used to evaluate how blood flows through your heart.  Transesophageal echocardiogram (TEE).  Cardiac monitoring. This allows your health care provider to monitor your heart rate and rhythm in real time.  Holter monitor. This is a portable device that records your heartbeat and can  help diagnose heart arrhythmias. It allows your health care provider to track your heart activity for several days, if needed.  Stress tests by exercise or by giving medicine that makes the heart beat faster. TREATMENT  Treatment of palpitations depends on the cause of your symptoms and can vary greatly. Most cases of palpitations do not require any treatment other than time, relaxation, and monitoring your symptoms. Other causes, such as atrial fibrillation, atrial flutter, or supraventricular tachycardia, usually require further treatment. HOME CARE INSTRUCTIONS   Avoid:  Caffeinated coffee, tea, soft drinks, diet pills, and energy drinks.  Chocolate.  Alcohol.  Stop smoking if you smoke.  Reduce your stress and anxiety. Things that can help you relax include:  A method of controlling things in your body, such as your heartbeats, with your mind (biofeedback).  Yoga.  Meditation.  Physical activity such as swimming, jogging, or walking.  Get plenty of rest and sleep. SEEK MEDICAL CARE IF:   You continue to have a fast or irregular heartbeat beyond 24 hours.  Your palpitations occur more often. SEEK IMMEDIATE MEDICAL CARE IF:  You have chest pain or shortness of breath.  You have a severe headache.  You feel dizzy or you faint. MAKE SURE YOU:  Understand these instructions.  Will watch your condition.  Will get help right away if you are not doing well or get worse.   This information is not intended to replace advice given to you by your health care provider. Make sure you discuss any questions you have with your health care provider.   Document Released: 02/15/2000 Document Revised: 02/22/2013 Document Reviewed: 04/18/2011 Elsevier Interactive Patient Education [employer]2016 Elsevier Inc.

## 2015-06-08 NOTE — ED Notes (Signed)
Pt reports hx of tachycardia and over few weeks she has noticed more frequent episodes. Today pt had a feeling of light headed and palpitations which she normally does not have. Pt talking in full and complete sentences with no difficulty at this time but is having intermittent dizziness. Denies hx of afib.

## 2015-06-09 LAB — T4, FREE: Free T4: 0.75 ng/dL (ref 0.61–1.12)

## 2015-06-09 LAB — TSH: TSH: 3.699 u[IU]/mL (ref 0.350–4.500)

## 2015-06-09 NOTE — ED Notes (Signed)
Discharge instructions reviewed with patient. Patient verbalized understanding. Patient ambulated to lobby without difficulty.   

## 2015-06-30 ENCOUNTER — Encounter: Payer: Self-pay | Admitting: Obstetrics and Gynecology

## 2015-07-03 ENCOUNTER — Encounter: Payer: Self-pay | Admitting: Obstetrics and Gynecology

## 2015-07-03 ENCOUNTER — Ambulatory Visit (INDEPENDENT_AMBULATORY_CARE_PROVIDER_SITE_OTHER): Payer: Managed Care, Other (non HMO) | Admitting: Obstetrics and Gynecology

## 2015-07-03 ENCOUNTER — Ambulatory Visit
Admission: RE | Admit: 2015-07-03 | Discharge: 2015-07-03 | Disposition: A | Discharge status: Home / Self Care | Payer: Managed Care, Other (non HMO) | Source: Ambulatory Visit | Attending: Family Medicine | Admitting: Family Medicine

## 2015-07-03 ENCOUNTER — Encounter: Payer: Self-pay | Admitting: Family Medicine

## 2015-07-03 ENCOUNTER — Ambulatory Visit (INDEPENDENT_AMBULATORY_CARE_PROVIDER_SITE_OTHER): Payer: Managed Care, Other (non HMO) | Admitting: Family Medicine

## 2015-07-03 VITALS — BP 122/75 | HR 86 | Ht 65.0 in | Wt 216.9 lb

## 2015-07-03 VITALS — BP 124/68 | HR 89 | Temp 98.2°F | Resp 18 | Ht 65.0 in | Wt 217.5 lb

## 2015-07-03 DIAGNOSIS — L0232 Furuncle of buttock: Secondary | ICD-10-CM

## 2015-07-03 DIAGNOSIS — M2012 Hallux valgus (acquired), left foot: Secondary | ICD-10-CM | POA: Insufficient documentation

## 2015-07-03 DIAGNOSIS — M25512 Pain in left shoulder: Secondary | ICD-10-CM | POA: Diagnosis not present

## 2015-07-03 DIAGNOSIS — M79672 Pain in left foot: Secondary | ICD-10-CM | POA: Insufficient documentation

## 2015-07-03 NOTE — Progress Notes (Signed)
Name: Teresa Guerrero   MRN: [id number]    DOB: [birth year]   Date:07/03/2015       Progress Note  Subjective  Chief Complaint  Chief Complaint  Patient presents with  . Foot Problem    second toe left foot  . Shoulder Pain    Shoulder Pain  The pain is present in the left shoulder and left arm. This is a new problem. The current episode started more than 1 month ago. There has been no history of extremity trauma. The quality of the pain is described as sharp. The pain is at a severity of 8/10 (varies between 3-4 to 8/10). The pain is moderate. Associated symptoms include stiffness (history of neck stiffness). Pertinent negatives include no fever or joint swelling. She has tried movement, NSAIDS and acetaminophen (massage therapy.) for the symptoms. The treatment provided moderate relief.   Left Foot Pain: Gradual onset, present for over 6 months, no injury. Started noticing her left toe was painful, now getting worse, painful to walk on. Distal part of toe swells up and gets worse throughout the day. She feels as if walking on  A ball while she is barefoot.  Past Medical History  Diagnosis Date  . GERD (gastroesophageal reflux disease)   . Climacteric syndrome     Past Surgical History  Procedure Laterality Date  . Cesarean section  1994  . Abdominal hysterectomy  2012    Menorrhagia, No cervix, No ovaries  . Endometrial ablation  2009  . Breast biopsy Left 2013    normal results    Family History  Problem Relation Age of Onset  . Diabetes Mother   . Thyroid disease Mother   . Hypertension Mother   . Heart disease Mother   . Hypertension Father   . Stroke Father   . Heart disease Father   . Colon cancer Brother     Social History   Social History  . Marital Status: Married    Spouse Name: N/A  . Number of Children: N/A  . Years of Education: N/A   Occupational History  . Not on file.   Social History Main Topics  . Smoking status: Never Smoker   . Smokeless  tobacco: Never Used  . Alcohol Use: 4.8 oz/week    8 Glasses of wine per week  . Drug Use: No  . Sexual Activity: Yes    Birth Control/ Protection: Surgical   Other Topics Concern  . Not on file   Social History Narrative     Current outpatient prescriptions:  .  aspirin 81 MG tablet, Take 81 mg by mouth daily., Disp: , Rfl:  .  Cholecalciferol (D 1000) 1000 units capsule, Take by mouth., Disp: , Rfl:  .  clindamycin (CLEOCIN) 300 MG capsule, , Disp: , Rfl:  .  cycloSPORINE (RESTASIS) 0.05 % ophthalmic emulsion, 1 drop 2 (two) times daily., Disp: , Rfl:  .  desloratadine (CLARINEX) 5 MG tablet, Take 5 mg by mouth as needed., Disp: , Rfl:  .  estropipate (OGEN) 0.75 MG tablet, Take 0.75 mg by mouth., Disp: , Rfl:  .  lidocaine (XYLOCAINE) 2 % jelly, Apply 1 application topically as needed., Disp: 30 mL, Rfl: 2 .  Multiple Vitamin (MULTIVITAMIN) tablet, Take 1 tablet by mouth daily., Disp: , Rfl:  .  Omega-3 Fatty Acids (FISH OIL) 500 MG CAPS, Take by mouth., Disp: , Rfl:  .  pantoprazole (PROTONIX) 40 MG tablet, , Disp: , Rfl:  .  vitamin C (  ASCORBIC ACID) 250 MG tablet, Take 250 mg by mouth daily., Disp: , Rfl:   Allergies  Allergen Reactions  . Formaldehyde Hives and Rash  . Kefzol [Cefazolin] Anaphylaxis and Cough    Other reaction(s): Other (See Comments)  . Penicillin G Anaphylaxis  . Penicillins Anaphylaxis  . Prednisone Anaphylaxis  . Azithromycin Rash  . Ciprofloxacin Rash     Review of Systems  Constitutional: Negative for fever and chills.  Musculoskeletal: Positive for back pain, joint pain and stiffness (history of neck stiffness).    Objective  Filed Vitals:   07/03/15 1441  BP: 124/68  Pulse: 89  Temp: 98.2 F (36.8 C)  Resp: 18  Height:  (1.651 m)  Weight: 217 lb 8 oz (98.657 kg)  SpO2: 95%    Physical Exam  Constitutional: She is oriented to person, place, and time and well-developed, well-nourished, and in no distress.    Musculoskeletal:       Left upper arm: She exhibits tenderness. She exhibits no swelling.       Arms:      Left foot: There is tenderness. There is no swelling and no deformity.  Tenderness to palpation over the medial border of left scapula,  Tenderness to palpation over the 2nd and 3rd toes both dorsal and ventral aspects, normal ROM and strength in toes,   Neurological: She is alert and oriented to person, place, and time.  Nursing note and vitals reviewed.     Assessment & Plan  1. Foot pain, left Rule out arthritis, we'll obtain x-ray. - DG Foot Complete Left; Future  2. Left shoulder pain Unclear etiology but likely arthritis (no radicular symptoms). Will obtain x-ray and follow-up. - DG Shoulder Left; Future   [facility] 07/03/2015 3:13 PM

## 2015-07-04 ENCOUNTER — Ambulatory Visit: Payer: Managed Care, Other (non HMO) | Admitting: Obstetrics and Gynecology

## 2015-07-04 NOTE — Progress Notes (Signed)
    GYNECOLOGY PROGRESS NOTE  Subjective:    Patient ID: Teresa Guerrero, female    DOB: February 07, 1966, 50 y.o.   MRN: [id number]  HPI  Patient is a 50 y.o. [id number] female who presents for complaints of another possible boil/abscess on left buttock.  Patient reports that she began to notice symptoms approximately 3 days ago, including a small knot.  Noted some bleeding from area.  Has been taking some of her remaining antibiotic from prior infection (Clindamycin) over the past several days and performing Sitz baths.  Denies fevers, chills.    The following portions of the patient's history were reviewed and updated as appropriate: allergies, current medications, past family history, past medical history, past social history, past surgical history and problem list.  Review of Systems Pertinent items noted in HPI and remainder of comprehensive ROS otherwise negative.   Objective:   Blood pressure 122/75, pulse 86, height 5\' 5"  (1.651 m), weight 216 lb 14.4 oz (98.385 kg), last menstrual period 03/03/2010. General appearance: alert and mild distress Pelvic: deferred  Buttock: There is an area characterized by a small flattened subcutaneous mass consistent with boil.  No erythema surrounding area.  Beginning to crust over.  No discharge expressed.  Location: left buttock ~ 2 cm from anus. Extremities: extremities normal, atraumatic, no cyanosis or edema Neurologic: Grossly normal   Assessment:   Cutaneous boil, resolving.   Plan:   To continue to apply hot compresses/sitz baths frequently to continue to promote drainage. Reassured that this represents a benign process. Oral antibiotics -- patient can complete current antibiotic course.  Discussed hygiene regimen to elp prevent further abscesses.  Can also try raw potato to area.    Hildred LaserAnika Krishna Dancel, MD [facility]

## 2015-07-06 ENCOUNTER — Telehealth: Payer: Self-pay | Admitting: Internal Medicine

## 2015-07-06 NOTE — Telephone Encounter (Signed)
Pt states she would like her X Ray results.

## 2015-07-06 NOTE — Telephone Encounter (Signed)
X-ray results have been reported to patient  

## 2015-07-10 ENCOUNTER — Ambulatory Visit: Payer: Managed Care, Other (non HMO) | Admitting: Family Medicine

## 2015-07-11 ENCOUNTER — Encounter: Payer: Self-pay | Admitting: Family Medicine

## 2015-07-11 ENCOUNTER — Ambulatory Visit (INDEPENDENT_AMBULATORY_CARE_PROVIDER_SITE_OTHER): Payer: Managed Care, Other (non HMO) | Admitting: Family Medicine

## 2015-07-11 VITALS — BP 122/81 | HR 90 | Temp 98.7°F | Resp 18 | Ht 65.0 in | Wt 215.8 lb

## 2015-07-11 DIAGNOSIS — M79672 Pain in left foot: Secondary | ICD-10-CM

## 2015-07-11 DIAGNOSIS — M79622 Pain in left upper arm: Secondary | ICD-10-CM | POA: Diagnosis not present

## 2015-07-11 MED ORDER — MELOXICAM 7.5 MG PO TABS: 7.5000 mg | ORAL_TABLET | Freq: Every day | ORAL | Status: DC

## 2015-07-11 NOTE — Progress Notes (Signed)
Name: Teresa Guerrero   MRN: [id number]    DOB: 12-04-1965   Date:07/11/2015       Progress Note  Subjective  Chief Complaint  Chief Complaint  Patient presents with  . Follow-up    Arthritis    HPI  Left Shoulder Pain: Pt. Presents for evaluation of left shoulder pain, present for over 4 months, slowly getting worse, has difficulty raising her left arm past the midline point. 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   Left Foot Pain: Pt. 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 Pt. does not recall any injury to her left foot.   Past Medical History  Diagnosis Date  . GERD (gastroesophageal reflux disease)   . Climacteric syndrome     Past Surgical History  Procedure Laterality Date  . Cesarean section  1994  . Abdominal hysterectomy  2012    Menorrhagia, No cervix, No ovaries  . Endometrial ablation  2009  . Breast biopsy Left 2013    normal results    Family History  Problem Relation Age of Onset  . Diabetes Mother   . Thyroid disease Mother   . Hypertension Mother   . Heart disease Mother   . Hypertension Father   . Stroke Father   . Heart disease Father   . Colon cancer Brother     Social History   Social History  . Marital Status: Married    Spouse Name: N/A  . Number of Children: N/A  . Years of Education: N/A   Occupational History  . Not on file.   Social History Main Topics  . Smoking status: Never Smoker   . Smokeless tobacco: Never Used  . Alcohol Use: 4.8 oz/week    8 Glasses of wine per week  . Drug Use: No  . Sexual Activity: Yes    Birth Control/ Protection: Surgical   Other Topics Concern  . Not on file   Social History Narrative     Current outpatient prescriptions:  .  aspirin 81 MG tablet, Take 81 mg by mouth daily., Disp: , Rfl:  .  Cholecalciferol (D  1000) 1000 units capsule, Take by mouth., Disp: , Rfl:  .  desloratadine (CLARINEX) 5 MG tablet, Take 5 mg by mouth as needed., Disp: , Rfl:  .  lidocaine (XYLOCAINE) 2 % jelly, Apply 1 application topically as needed., Disp: 30 mL, Rfl: 2 .  Multiple Vitamin (MULTIVITAMIN) tablet, Take 1 tablet by mouth daily., Disp: , Rfl:  .  pantoprazole (PROTONIX) 40 MG tablet, , Disp: , Rfl:  .  vitamin C (ASCORBIC ACID) 250 MG tablet, Take 250 mg by mouth daily., Disp: , Rfl:  .  clindamycin (CLEOCIN) 300 MG capsule, Reported on 07/11/2015, Disp: , Rfl:  .  cycloSPORINE (RESTASIS) 0.05 % ophthalmic emulsion, 1 drop 2 (two) times daily. Reported on 07/11/2015, Disp: , Rfl:  .  estropipate (OGEN) 0.75 MG tablet, Take 0.75 mg by mouth. Reported on 07/11/2015, Disp: , Rfl:  .  Omega-3 Fatty Acids (FISH OIL) 500 MG CAPS, Take by mouth. Reported on 07/11/2015, Disp: , Rfl:   Allergies  Allergen Reactions  . Formaldehyde Hives and Rash  . Kefzol [Cefazolin] Anaphylaxis and Cough    Other reaction(s): Other (See Comments)  . Penicillin G Anaphylaxis  . Penicillins Anaphylaxis  . Prednisone Anaphylaxis  . Azithromycin  Rash  . Ciprofloxacin Rash     Review of Systems  Constitutional: Negative for fever and chills.  Musculoskeletal: Positive for joint pain.     Objective  Filed Vitals:   07/11/15 1402  BP: 122/81  Pulse: 90  Temp: 98.7 F (37.1 C)  TempSrc: Oral  Resp: 18  Height:  (1.651 m)  Weight: 215 lb 12.8 oz (97.886 kg)  SpO2: 98%    Physical Exam  Constitutional: She is well-developed, well-nourished, and in no distress.  Musculoskeletal:       Left upper arm: She exhibits tenderness. She exhibits no swelling and no deformity.       Arms:      Left foot: There is tenderness. There is no swelling and no deformity.       Feet:  Left foot 2nd toe is tender to palpation, mildly swollen Tenderness to palpation over the left posterior arm, no visible swelling,   Nursing note and  vitals reviewed.    Assessment & Plan  1. 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 - Ambulatory referral to Podiatry - meloxicam (MOBIC) 7.5 MG tablet; Take 1 tablet (7.5 mg total) by mouth daily.  Dispense: 30 tablet; Refill: 0  2. Pain in left upper arm Pain is more so in the left upper arm, started on meloxicam If no improvement, consider MRI to rule out rotator cuff pathology. - meloxicam (MOBIC) 7.5 MG tablet; Take 1 tablet (7.5 mg total) by mouth daily.  Dispense: 30 tablet; Refill: 0   Taym Twist Asad [facility] 07/11/2015 2:21 PM

## 2015-07-16 ENCOUNTER — Encounter: Payer: Self-pay | Admitting: Cardiovascular Disease

## 2015-07-16 ENCOUNTER — Ambulatory Visit (INDEPENDENT_AMBULATORY_CARE_PROVIDER_SITE_OTHER): Payer: Managed Care, Other (non HMO) | Admitting: Cardiovascular Disease

## 2015-07-16 VITALS — BP 126/84 | HR 65 | Ht 65.0 in | Wt 214.0 lb

## 2015-07-16 DIAGNOSIS — R079 Chest pain, unspecified: Secondary | ICD-10-CM

## 2015-07-16 DIAGNOSIS — R000 Tachycardia, unspecified: Secondary | ICD-10-CM | POA: Diagnosis not present

## 2015-07-16 DIAGNOSIS — R002 Palpitations: Secondary | ICD-10-CM | POA: Diagnosis not present

## 2015-07-16 DIAGNOSIS — Z8249 Family history of ischemic heart disease and other diseases of the circulatory system: Secondary | ICD-10-CM | POA: Diagnosis not present

## 2015-07-16 DIAGNOSIS — Z0000 Encounter for general adult medical examination without abnormal findings: Secondary | ICD-10-CM | POA: Insufficient documentation

## 2015-07-16 NOTE — Assessment & Plan Note (Signed)
Etiology of her tachycardia is unclear No recent monitor except from Holter done in 2012, unavailable at this time Long discussion concerning various ways to treat her tachycardia including pill in the pocket, or beta blocker daily or a monitor to determine what her rhythm is. She prefers to start with a 30 day monitor. Does not have tachycardia daily, sometimes will go several days without any symptoms. We will meet in the clinic after monitor is complete to discuss results

## 2015-07-16 NOTE — Assessment & Plan Note (Signed)
Unable to exclude APCs or PVCs 30 day monitor has been ordered

## 2015-07-16 NOTE — Assessment & Plan Note (Addendum)
Family history of coronary artery disease, father She is interested in CT coronary calcium scoring for risk stratification. This will be scheduled for her Prior cholesterol 220s in 2012 We have ordered repeat lipid panel  . Total encounter time more than 45 minutes  Greater than 50% was spent in counseling and coordination of care with the patient

## 2015-07-16 NOTE — Patient Instructions (Addendum)
You are doing well. No medication changes were made.  We will order a CT coronary calcium score for family history Friday, May 19 @ 8:30, please arrive @ 8:15 There is a one-time fee of $150 due at the time of your procedure  We will order a 30 day monitor for tachycardia, palpitations.  Event monitors are medical devices that record the heart's electrical activity. Doctors most often [place] these monitors to diagnose arrhythmias. Arrhythmias are problems with the speed or rhythm of the heartbeat. The monitor is a small, portable device. You can wear one while you do your normal daily activities. This is usually used to diagnose what is causing palpitations/syncope (passing out). You will receive a call from [employer] to verify your address before they mail the monitor to your home.    We will order a cholesterol panel (Through [facility])  Please call us if you have new issues that need to be addressed before your next appt.  Your physician wants you to follow-up in: 6 weeks  Date & time: _________________________________  Coronary Calcium Scan A coronary calcium scan is an imaging test used to look for deposits of calcium and other fatty materials (plaques) in the inner lining of the blood vessels of your heart (coronary arteries). These deposits of calcium and plaques can partly clog and narrow the coronary arteries without producing any symptoms or warning signs. This puts you at risk for a heart attack. This test can detect these deposits before symptoms develop.  LET [facility] CARE PROVIDER KNOW ABOUT:  Any allergies you have.  All medicines you are taking, including vitamins, herbs, eye drops, creams, and over-the-counter medicines.  Previous problems you or members of your family have had with the use of anesthetics.  Any blood disorders you have.  Previous surgeries you have had.  Medical conditions you have.  Possibility of pregnancy, if this applies. RISKS AND  COMPLICATIONS Generally, this is a safe procedure. However, as with any procedure, complications can occur. This test involves the use of radiation. Radiation exposure can be dangerous to a pregnant woman and her unborn baby. If you are pregnant, you should not have this procedure done.  BEFORE THE PROCEDURE There is no special preparation for the procedure. PROCEDURE  You will need to undress and put on a hospital gown. You will need to remove any jewelry around your neck or chest.  Sticky electrodes are placed on your chest and are connected to an electrocardiogram (EKG or electrocardiography) machine to recorda tracing of the electrical activity of your heart.  A CT scanner will take pictures of your heart. During this time, you will be asked to lie still and hold your breath for 2-3 seconds while a picture is being taken of your heart. AFTER THE PROCEDURE   You will be allowed to get dressed.  You can return to your normal activities after the scan is done.   This information is not intended to replace advice given to you by your health care provider. Make sure you discuss any questions you have with your health care provider.   Document Released: 08/16/2007 Document Revised: 02/22/2013 Document Reviewed: 10/25/2012 Elsevier Interactive Patient Education 2016 [employer]. Cardiac Event Monitoring A cardiac event monitor is a small recording device used to help detect abnormal heart rhythms (arrhythmias). The monitor is used to record heart rhythm when noticeable symptoms such as the following occur:  Fast heartbeats (palpitations), such as heart racing or fluttering.  Dizziness.  Fainting or light-headedness.  Unexplained weakness. The monitor is wired to two electrodes placed on your chest. Electrodes are flat, sticky disks that attach to your skin. The monitor can be worn for up to 30 days. You will wear the monitor at all times, except when bathing.  HOW TO USE YOUR CARDIAC  EVENT MONITOR A technician will prepare your chest for the electrode placement. The technician will show you how to place the electrodes, how to work the monitor, and how to replace the batteries. Take time to practice using the monitor before you leave the office. Make sure you understand how to send the information from the monitor to your health care provider. This requires a telephone with a landline, not a cell phone. You need to:  Wear your monitor at all times, except when you are in water:  Do not get the monitor wet.  Take the monitor off when bathing. Do not swim or use a hot tub with it on.  Keep your skin clean. Do not put body lotion or moisturizer on your chest.  Change the electrodes daily or any time they stop sticking to your skin. You might need to use tape to keep them on.  It is possible that your skin under the electrodes could become irritated. To keep this from happening, try to put the electrodes in slightly different places on your chest. However, they must remain in the area under your left breast and in the upper right section of your chest.  Make sure the monitor is safely clipped to your clothing or in a location close to your body that your health care provider recommends.  Press the button to record when you feel symptoms of heart trouble, such as dizziness, weakness, light-headedness, palpitations, thumping, shortness of breath, unexplained weakness, or a fluttering or racing heart. The monitor is always on and records what happened slightly before you pressed the button, so do not worry about being too late to get good information.  Keep a diary of your activities, such as walking, doing chores, and taking medicine. It is especially important to note what you were doing when you pushed the button to record your symptoms. This will help your health care provider determine what might be contributing to your symptoms. The information stored in your monitor will be  reviewed by your health care provider alongside your diary entries.  Send the recorded information as recommended by your health care provider. It is important to understand that it will take some time for your health care provider to process the results.  Change the batteries as recommended by your health care provider. SEEK IMMEDIATE MEDICAL CARE IF:   You have chest pain.  You have extreme difficulty breathing or shortness of breath.  You develop a very fast heartbeat that persists.  You develop dizziness that does not go away.  You faint or constantly feel you are about to faint.   This information is not intended to replace advice given to you by your health care provider. Make sure you discuss any questions you have with your health care provider.   Document Released: 11/27/2007 Document Revised: 03/10/2014 Document Reviewed: 08/16/2012 Elsevier Interactive Patient Education [employer] Elsevier Inc.

## 2015-07-16 NOTE — Progress Notes (Signed)
Patient ID: Teresa Guerrero, female    DOB: February 01, 1966, 50 y.o.   MRN: [id number]  HPI Comments: Teresa Guerrero is a pleasant 50 year old woman with history of tachycardia and palpitations dating back to 2012, obesity, family history of coronary artery disease who presents by self-referral for consultation of her tachycardia and risk factors for coronary disease.  She reports having tachycardia May 2012, was sent to the hospital for evaluation. Records reviewed from her hospitalization showing normal echocardiogram, normal stress test She was told she had benign tachycardia and was not treated with medication  She did have elevated triglycerides, started on TriCor With diet and exercise triglycerides improved and she has stopped TriCor In the summer of 2016 she stopped her estrogen, felt that her fluttering and skips he came worse They also seem to get worse when she is anxious Perhaps more episodes of tachycardia and palpitations in the past several months  Went to the emergency room one month ago April 2017 for tachycardia EKG at that time showed normal sinus rhythm, no significant arrhythmia and she was discharged home after lab work was benign Lab work reviewed with her showing normal thyroid, CBC, chemistries Potassium 3.4 discussed with her  She denies any significant chest pain on exertion but she is concerned as father had coronary disease, MI Mother had electrical disease, ablations, coronary calcifications  Patient's total cholesterol 226, LDL 119 in 2012, no lipid panel since that time  EKG on today's visit shows normal sinus rhythm with rate 65 bpm, no significant ST or T-wave changes     Allergies  Allergen Reactions  . Formaldehyde Hives and Rash  . Kefzol [Cefazolin] Anaphylaxis and Cough    Other reaction(s): Other (See Comments)  . Penicillin G Anaphylaxis  . Penicillins Anaphylaxis  . Prednisone Anaphylaxis  . Azithromycin Rash  . Ciprofloxacin Rash    Current  Outpatient Prescriptions on File Prior to Visit  Medication Sig Dispense Refill  . aspirin 81 MG tablet Take 81 mg by mouth daily.    . Cholecalciferol (D 1000) 1000 units capsule Take by mouth.    . desloratadine (CLARINEX) 5 MG tablet Take 5 mg by mouth as needed. Reported on 07/16/2015    . estropipate (OGEN) 0.75 MG tablet Take 0.75 mg by mouth. Reported on 07/11/2015    . lidocaine (XYLOCAINE) 2 % jelly Apply 1 application topically as needed. 30 mL 2  . meloxicam (MOBIC) 7.5 MG tablet Take 1 tablet (7.5 mg total) by mouth daily. 30 tablet 0  . Multiple Vitamin (MULTIVITAMIN) tablet Take 1 tablet by mouth daily.    . pantoprazole (PROTONIX) 40 MG tablet Take 40 mg by mouth daily.     . vitamin C (ASCORBIC ACID) 250 MG tablet Take 250 mg by mouth daily.     No current facility-administered medications on file prior to visit.    Past Medical History  Diagnosis Date  . GERD (gastroesophageal reflux disease)   . Climacteric syndrome   . Allergic rhinitis   . Asthma   . Diverticulosis   . IBS (irritable bowel syndrome)     Past Surgical History  Procedure Laterality Date  . Cesarean section  1994  . Abdominal hysterectomy  2012    Menorrhagia, No cervix, No ovaries  . Endometrial ablation  2009  . Breast biopsy Left 2013    normal results  . Appendectomy    . Cholecystectomy    . Knee arthroscopy      right knee  Social History  reports that she has never smoked. She has never used smokeless tobacco. She reports that she drinks about 4.8 oz of alcohol per week. She reports that she does not use illicit drugs.  Family History family history includes Colon cancer in her brother; Diabetes in her mother; Heart disease in her father and mother; Hypertension in her father and mother; Stroke in her father; Thyroid disease in her mother.   Review of Systems  Constitutional: Negative.   Respiratory: Negative.   Cardiovascular: Positive for palpitations.       Tachycardia   Gastrointestinal: Negative.   Musculoskeletal: Negative.   Neurological: Negative.   Hematological: Negative.   Psychiatric/Behavioral: Negative.   All other systems reviewed and are negative.   BP 126/84 mmHg  Pulse 65  Ht 5\' 5"  (1.651 m)  Wt 214 lb (97.07 kg)  BMI 35.61 kg/m2  LMP 03/03/2010  Physical Exam  Constitutional: She is oriented to person, place, and time. She appears well-developed and well-nourished.  Obese  HENT:  Head: Normocephalic.  Nose: Nose normal.  Mouth/Throat: Oropharynx is clear and moist.  Eyes: Conjunctivae are normal. Pupils are equal, round, and reactive to light.  Neck: Normal range of motion. Neck supple. No JVD present.  Cardiovascular: Normal rate, regular rhythm, normal heart sounds and intact distal pulses.  Exam reveals no gallop and no friction rub.   No murmur heard. Pulmonary/Chest: Effort normal and breath sounds normal. No respiratory distress. She has no wheezes. She has no rales. She exhibits no tenderness.  Abdominal: Soft. Bowel sounds are normal. She exhibits no distension. There is no tenderness.  Musculoskeletal: Normal range of motion. She exhibits no edema or tenderness.  Lymphadenopathy:    She has no cervical adenopathy.  Neurological: She is alert and oriented to person, place, and time. Coordination normal.  Skin: Skin is warm and dry. No rash noted. No erythema.  Psychiatric: She has a normal mood and affect. Her behavior is normal. Judgment and thought content normal.

## 2015-07-19 ENCOUNTER — Telehealth: Payer: Self-pay | Admitting: Cardiovascular Disease

## 2015-07-19 ENCOUNTER — Ambulatory Visit: Payer: Managed Care, Other (non HMO) | Admitting: Cardiovascular Disease

## 2015-07-19 NOTE — Telephone Encounter (Signed)
Notified patient regarding her event monitor.  The patient stated that [employer] did verify her information address etc.  [employer] told her she should receive the monitor today.  The patient will contact [employer] at [phone] to verify why she has not received the monitor yet.

## 2015-07-19 NOTE — Telephone Encounter (Signed)
Pt states she has not received her event  Monitor yet. Please call.

## 2015-07-20 ENCOUNTER — Ambulatory Visit (INDEPENDENT_AMBULATORY_CARE_PROVIDER_SITE_OTHER)
Admission: RE | Admit: 2015-07-20 | Discharge: 2015-07-20 | Disposition: A | Discharge status: Home / Self Care | Payer: Managed Care, Other (non HMO) | Source: Ambulatory Visit | Attending: Cardiovascular Disease | Admitting: Cardiovascular Disease

## 2015-07-20 ENCOUNTER — Encounter (INDEPENDENT_AMBULATORY_CARE_PROVIDER_SITE_OTHER): Payer: Managed Care, Other (non HMO)

## 2015-07-20 DIAGNOSIS — R002 Palpitations: Secondary | ICD-10-CM | POA: Diagnosis not present

## 2015-07-20 DIAGNOSIS — R000 Tachycardia, unspecified: Secondary | ICD-10-CM

## 2015-07-20 DIAGNOSIS — R079 Chest pain, unspecified: Secondary | ICD-10-CM

## 2015-07-20 DIAGNOSIS — Z8249 Family history of ischemic heart disease and other diseases of the circulatory system: Secondary | ICD-10-CM

## 2015-07-23 ENCOUNTER — Ambulatory Visit: Payer: Managed Care, Other (non HMO) | Admitting: Cardiovascular Disease

## 2015-07-31 ENCOUNTER — Encounter: Payer: Self-pay | Admitting: Obstetrics and Gynecology

## 2015-07-31 ENCOUNTER — Ambulatory Visit (INDEPENDENT_AMBULATORY_CARE_PROVIDER_SITE_OTHER): Payer: Managed Care, Other (non HMO) | Admitting: Obstetrics and Gynecology

## 2015-07-31 VITALS — BP 132/79 | HR 84 | Ht 65.0 in | Wt 215.6 lb

## 2015-07-31 DIAGNOSIS — Z9229 Personal history of other drug therapy: Secondary | ICD-10-CM

## 2015-07-31 DIAGNOSIS — N951 Menopausal and female climacteric states: Secondary | ICD-10-CM

## 2015-07-31 NOTE — Progress Notes (Signed)
   GYNECOLOGY CLINIC PROGRESS NOTE  Subjective:    Teresa Guerrero is a 50 y.o. female who presents to discuss hormone replacement therapy. Patient is requesting hormone replacement therapy due to hot flashes, moodiness, "memory fog", and tearfulness. She is s/p TAH/BSO in 2012 for benign reasons. The patient is not currently taking hormone replacement therapy.  Reports previous use up until approximately 1 years ago (was placed on this for several years after her hysterectomy) but self-discontinued. Patient denies post-menopausal vaginal bleeding.  Is currently using black cohosh without relief. The patient is not currently sexually active. GYN screening history: last pap: approximate date 10/2014 and was normal and last mammogram: approximate date 03/2010 (last one prior to hysterectomy) and was normal. 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   Of note, patient also is wondering if her thyroid could be the reason for her symptoms, as her mother also has a h/o thyroid disease and has noted similar symptoms at one point.   Current hormone therapy:  None  Previous hormone therapy:  Estrogen: Ogen 0.75 mg Reason for starting HRT: hysterectomy with BSO Bleeding in past on HRT: none  Menstrual History: OB History    Gravida Para Term Preterm AB TAB SAB Ectopic Multiple Living   1 1 1       1       Menarche age: [age] Patient's last menstrual period was 03/03/2010.    The following portions of the patient's history were reviewed and updated as appropriate: allergies, current medications, past family history, past medical history, past social history, past surgical history and problem list.  Review of Systems Pertinent items noted in HPI and remainder of comprehensive ROS otherwise negative.    Objective:     BP 132/79 mmHg  Pulse 84  Ht 5\' 5"  (1.651 m)  Wt 215 lb 9.6 oz (97.796 kg)  BMI 35.88 kg/m2  LMP 03/03/2010 No exam performed  today.  Assessment:    Menopausal vasomotor symptoms in 50 y.o. woman.    History of prior HRT use   Plan:    Patient requests HRT. Risks and benefits of HRT, including recent evidence on HRT effects on breast cancer and heart disease, were discussed.  After discussion of HRT and alternative treatment options (including SSRIs/SNRIs, Neurontin, Clonidine), patient desires to try a trial of Brisdelle first.  Sample given (7 days).  Patient to notify MD by phone if symptoms managed with Brisdelle, and can call in prescription.  Otherwise, can resume on prior prescription of Ogen.  Follow up in 6 weeks.     A total of 15 minutes were spent face-to-face with the patient during this encounter and over half of that time dealt with counseling and coordination of care.   Hildred LaserAnika Demonta Wombles, MD [facility]

## 2015-08-04 ENCOUNTER — Encounter: Payer: Self-pay | Admitting: Obstetrics and Gynecology

## 2015-08-06 ENCOUNTER — Encounter: Payer: Self-pay | Admitting: Obstetrics and Gynecology

## 2015-08-06 ENCOUNTER — Other Ambulatory Visit: Payer: Self-pay | Admitting: Obstetrics and Gynecology

## 2015-08-06 MED ORDER — ESTROPIPATE 0.75 MG PO TABS: 0.7500 mg | ORAL_TABLET | Freq: Every day | ORAL | Status: DC

## 2015-08-07 ENCOUNTER — Telehealth: Payer: Self-pay | Admitting: Obstetrics and Gynecology

## 2015-08-07 NOTE — Telephone Encounter (Signed)
Patient called stating she thought she was having a reaction to brisdelle so she stopped taking it. She stated it made her gittery and gave her a migraine. She just wanted to let Dr Valentino Saxonherry know.

## 2015-08-07 NOTE — Telephone Encounter (Signed)
Issue resolved via myChart. See mychart encounter

## 2015-08-13 ENCOUNTER — Ambulatory Visit: Payer: Managed Care, Other (non HMO) | Admitting: Family Medicine

## 2015-08-13 ENCOUNTER — Telehealth: Payer: Self-pay | Admitting: Cardiovascular Disease

## 2015-08-13 NOTE — Telephone Encounter (Signed)
[employer] called to notify us of events that occurred on patients event monitor today. Patient called back in and stated that this morning with the first episode she was waking up and had a "Little flutter". The second episode this afternoon was while she was at work and it was "longer and stronger". She stated that she was dizzy, short of breath, and her heart rate was fast. She said that she held her breath and bore down which made it stop. Let her know that I would forward to Dr. Mariah MillingGollan and his nurse for their review and someone would be in touch.

## 2015-08-13 NOTE — Telephone Encounter (Signed)
Left voicemail message for patient to call back regarding event monitor results. Results placed on Mandi's desk.

## 2015-08-15 ENCOUNTER — Encounter: Payer: Self-pay | Admitting: Cardiovascular Disease

## 2015-08-15 NOTE — Telephone Encounter (Signed)
Rhythm on monitor appears to be SVT, Rate very fast, Options include taking a pill on daily basis to suppress rhythm or as needed (less effective) Best option might be ablation, done in [place], to get rid of rhythm

## 2015-08-15 NOTE — Telephone Encounter (Signed)
Spoke w/ pt.  Advised her of Dr. Windell HummingbirdGollan's recommendation.  She verbalizes understanding; she would prefer to come in and speak w/ Dr. Mariah MillingGollan regarding all of her options. She is sched to come in 08/27/15 for 30 monitor f/u.  Asked her to call back if we can be of further assistance before that time.

## 2015-08-24 ENCOUNTER — Encounter: Payer: Self-pay | Admitting: Family Medicine

## 2015-08-24 ENCOUNTER — Ambulatory Visit: Payer: Managed Care, Other (non HMO) | Admitting: Sports Medicine

## 2015-08-27 ENCOUNTER — Ambulatory Visit (INDEPENDENT_AMBULATORY_CARE_PROVIDER_SITE_OTHER): Payer: Managed Care, Other (non HMO) | Admitting: Cardiovascular Disease

## 2015-08-27 ENCOUNTER — Encounter: Payer: Self-pay | Admitting: Cardiovascular Disease

## 2015-08-27 VITALS — BP 114/84 | HR 67 | Ht 65.0 in | Wt 218.0 lb

## 2015-08-27 DIAGNOSIS — I471 Supraventricular tachycardia: Secondary | ICD-10-CM

## 2015-08-27 DIAGNOSIS — R002 Palpitations: Secondary | ICD-10-CM | POA: Diagnosis not present

## 2015-08-27 DIAGNOSIS — M79672 Pain in left foot: Secondary | ICD-10-CM

## 2015-08-27 DIAGNOSIS — R000 Tachycardia, unspecified: Secondary | ICD-10-CM

## 2015-08-27 DIAGNOSIS — I493 Ventricular premature depolarization: Secondary | ICD-10-CM | POA: Insufficient documentation

## 2015-08-27 NOTE — Progress Notes (Signed)
Patient ID: Teresa Guerrero, female   DOB: 19-Dec-1965, 50 y.o.   MRN: [id number] Cardiology Office Note  Date:  08/27/2015   ID:  Teresa RampMary Guerrero, DOB 19-Dec-1965, MRN [id number]  PCP:  Kendra OpitzShah, Syed M, MD   Chief Complaint  Patient presents with  . other    F/u preventice. Meds reviewed verbally with pt.    HPI:  Teresa Josie Saunderseppo is a pleasant 50 year old woman with history of tachycardia and palpitations dating back to 2012, obesity, family history of coronary artery disease who presents For routine follow-up for SVT  On her last clinic visit we ordered 30 day monitor Follow-up on the monitor today shows she had 2 episodes of SVT rate 180 bpm on 08/13/2015 Symptoms went for approximately 20 minutes, broke on the wrong She does report one other episode that she did not have the monitor in place, feels she had 3 episodes over the month  In general she feels this is frequent, typically does not have as many episodes Feels the episodes presents with stress, poor sleep, when she rushes, sometimes caffeine Recently started back on estrogen therapy, feels this may have helped her symptoms Monitor also showed frequent PVCs which she does appreciate Otherwise she feels well with no complaints  Scheduled to have foot surgery with Dr. Clide CliffKline. EKG on today's visit shows normal sinus rhythm with rate 67 bpm, no significant ST or T-wave changes  Other past medical history reviewed  She reports having tachycardia May 2012, was sent to the hospital for evaluation. Records reviewed from her hospitalization showing normal echocardiogram, normal stress test She was told she had benign tachycardia and was not treated with medication  She did have elevated triglycerides, started on TriCor With diet and exercise triglycerides improved and she has stopped TriCor In the summer of 2016 she stopped her estrogen, felt that her fluttering and skips he came worse They also seem to get worse when she is anxious Perhaps more  episodes of tachycardia and palpitations in the past several months  Went to the emergency room one month ago April 2017 for tachycardia EKG at that time showed normal sinus rhythm, no significant arrhythmia and she was discharged home after lab work was benign Lab work reviewed with her showing normal thyroid, CBC, chemistries Potassium 3.4 discussed with her  She denies any significant chest pain on exertion but she is concerned as father had coronary disease, MI Mother had electrical disease, ablations, coronary calcifications  Patient's total cholesterol 226, LDL 119 in 2012, no lipid panel since that time   PMH:   has a past medical history of GERD (gastroesophageal reflux disease); Climacteric syndrome; Allergic rhinitis; Asthma; Diverticulosis; and IBS (irritable bowel syndrome).  PSH:    Past Surgical History  Procedure Laterality Date  . Cesarean section  1994  . Abdominal hysterectomy  2012    Menorrhagia, No cervix, No ovaries  . Endometrial ablation  2009  . Breast biopsy Left 2013    normal results  . Appendectomy    . Cholecystectomy    . Knee arthroscopy      right knee     Current Outpatient Prescriptions  Medication Sig Dispense Refill  . aspirin 81 MG tablet Take 81 mg by mouth daily.    . Cholecalciferol (D 1000) 1000 units capsule Take 1,000 Units by mouth daily.     Marland Kitchen. desloratadine (CLARINEX) 5 MG tablet Take 5 mg by mouth as needed. Reported on 07/16/2015    . estropipate (OGEN) 0.75 MG  tablet Take 1 tablet (0.75 mg total) by mouth daily. 30 tablet 11  . Multiple Vitamin (MULTIVITAMIN) tablet Take 1 tablet by mouth daily.    . pantoprazole (PROTONIX) 40 MG tablet Take 40 mg by mouth daily.     . vitamin C (ASCORBIC ACID) 250 MG tablet Take 250 mg by mouth daily.     No current facility-administered medications for this visit.     Allergies:   Formaldehyde; Kefzol; Penicillin g; Penicillins; Prednisone; Azithromycin; and Ciprofloxacin   Social  History:  The patient  reports that she has never smoked. She has never used smokeless tobacco. She reports that she drinks about 4.8 oz of alcohol per week. She reports that she does not use illicit drugs.   Family History:   family history includes Colon cancer in her brother; Diabetes in her mother; Heart disease in her father and mother; Hypertension in her father and mother; Stroke in her father; Thyroid disease in her mother.    Review of Systems: Review of Systems  Constitutional: Negative.   Respiratory: Negative.   Cardiovascular: Positive for palpitations.       Tachycardia  Gastrointestinal: Negative.   Musculoskeletal: Negative.   Neurological: Negative.   Psychiatric/Behavioral: Negative.   All other systems reviewed and are negative.    PHYSICAL EXAM: VS:  BP 114/84 mmHg  Pulse 67  Ht 5\' 5"  (1.651 m)  Wt 218 lb (98.884 kg)  BMI 36.28 kg/m2  LMP 03/03/2010 , BMI Body mass index is 36.28 kg/(m^2). GEN: Well nourished, well developed, in no acute distress HEENT: normal Neck: no JVD, carotid bruits, or masses Cardiac: RRR; no murmurs, rubs, or gallops,no edema  Respiratory:  clear to auscultation bilaterally, normal work of breathing GI: soft, nontender, nondistended, + BS Teresa: no deformity or atrophy Skin: warm and dry, no rash Neuro:  Strength and sensation are intact Psych: euthymic mood, full affect    Recent Labs: 06/07/2015: TSH 3.699 06/08/2015: BUN 19; Creatinine, Ser 0.90; Hemoglobin 15.2; Platelets 250; Potassium 3.4*; Sodium 135    Lipid Panel No results found for: CHOL, HDL, LDLCALC, TRIG    Wt Readings from Last 3 Encounters:  08/27/15 218 lb (98.884 kg)  07/31/15 215 lb 9.6 oz (97.796 kg)  07/16/15 214 lb (97.07 kg)       ASSESSMENT AND PLAN:  Paroxysmal SVT (supraventricular tachycardia) (HCC) Several runs of SVT on recent 30 day monitor She is wondering whether estrogen therapy might help her symptoms Each episode lasting 20 minutes,  would likely not respond to pill in the pocket Again went over carotid sinus massage, Valsalva maneuver She is not ready for ablation at this time, will call us if symptoms get worse Potentially could try flecainide for symptoms if they increase in frequency or duration  Foot pain, left Acceptable risk for upcoming foot surgery with Dr. Graciela HusbandsKlein No further testing needed  Frequent PVCs Documented on 30 day monitor, symptomatic She'll call us if she would like medication  Disposition:   F/U  12 months Recommended she call our office if she would like medications for heart arrhythmia   Total encounter time more than 25 minutes  Greater than 50% was spent in counseling and coordination of care with the patient     Orders Placed This Encounter  Procedures  . EKG 12-Lead     Signed, Dossie Arbourim Gollan, M.D., Ph.D. 08/27/2015  [facility] [place] [phone]

## 2015-08-27 NOTE — Patient Instructions (Signed)
Medication Instructions:   Please call if palpitations or tachycardia get worse We could try medication (flecainide)    Follow-Up: It was a pleasure seeing you in the office today. Please call us if you have new issues that need to be addressed before your next appt.  [phone]  Your physician wants you to follow-up in: 12 months.  You will receive a reminder letter in the mail two months in advance. If you don't receive a letter, please call our office to schedule the follow-up appointment.  If you need a refill on your cardiac medications before your next appointment, please call your pharmacy.

## 2015-08-30 ENCOUNTER — Encounter
Admission: RE | Admit: 2015-08-30 | Discharge: 2015-08-30 | Disposition: A | Discharge status: Home / Self Care | Payer: Managed Care, Other (non HMO) | Source: Ambulatory Visit | Attending: Podiatry | Admitting: Podiatry

## 2015-08-30 DIAGNOSIS — Z01812 Encounter for preprocedural laboratory examination: Secondary | ICD-10-CM | POA: Diagnosis not present

## 2015-08-30 HISTORY — DX: Cardiac arrhythmia, unspecified: I49.9

## 2015-08-30 HISTORY — DX: Other complications of anesthesia, initial encounter: T88.59XA

## 2015-08-30 HISTORY — DX: Unspecified osteoarthritis, unspecified site: M19.90

## 2015-08-30 HISTORY — DX: Personal history of other diseases of the digestive system: Z87.19

## 2015-08-30 HISTORY — DX: Other specified postprocedural states: Z98.890

## 2015-08-30 HISTORY — DX: Adverse effect of unspecified anesthetic, initial encounter: T41.45XA

## 2015-08-30 HISTORY — DX: Nausea with vomiting, unspecified: R11.2

## 2015-08-30 LAB — CBC
HCT: 42.2 % (ref 35.0–47.0)
HEMOGLOBIN: 14.3 g/dL (ref 12.0–16.0)
MCH: 31.4 pg (ref 26.0–34.0)
MCHC: 33.9 g/dL (ref 32.0–36.0)
MCV: 92.5 fL (ref 80.0–100.0)
PLATELETS: 181 10*3/uL (ref 150–440)
RBC: 4.56 MIL/uL (ref 3.80–5.20)
RDW: 13.8 % (ref 11.5–14.5)
WBC: 8.3 10*3/uL (ref 3.6–11.0)

## 2015-08-30 LAB — DIFFERENTIAL
BASOS ABS: 0.1 10*3/uL (ref 0–0.1)
Basophils Relative: 1 %
Eosinophils Absolute: 0.3 10*3/uL (ref 0–0.7)
LYMPHS ABS: 2.4 10*3/uL (ref 1.0–3.6)
Monocytes Absolute: 0.4 10*3/uL (ref 0.2–0.9)
Monocytes Relative: 5 %
NEUTROS ABS: 5.1 10*3/uL (ref 1.4–6.5)

## 2015-08-30 LAB — POTASSIUM: POTASSIUM: 3.8 mmol/L (ref 3.5–5.1)

## 2015-08-30 NOTE — Patient Instructions (Signed)
  Your procedure is scheduled on: 09/07/15 Report to Day Surgery. [facility] To find out your arrival time please call [phone] between 1PM - 3PM on 09/06/15 Remember: Instructions that are not followed completely may result in serious medical risk, up to and including death, or upon the discretion of your surgeon and anesthesiologist your surgery may need to be rescheduled.    X____ 1. Do not eat food or drink liquids after midnight. No gum chewing or hard candies.     __X__ 2. No Alcohol for 24 hours before or after surgery.   __X__ 3. Do Not Smoke For 24 Hours Prior to Your Surgery.   ____ 4. Bring all medications with you on the day of surgery if instructed.    _X___ 5. Notify your doctor if there is any change in your medical condition     (cold, fever, infections).       Do not wear jewelry, make-up, hairpins, clips or nail polish.  Do not wear lotions, powders, or perfumes. You may wear deodorant.  Do not shave 48 hours prior to surgery. Men may shave face and neck.  Do not bring valuables to the hospital.    [facility] not responsible for any belongings or valuables.               Contacts, dentures or bridgework may not be worn into surgery.  Leave your suitcase in the car. After surgery it may be brought to your room.  For patients admitted to the hospital, discharge time is determined by your                treatment team.   Patients discharged the day of surgery will not be allowed to drive home.   Please read over the following fact sheets that you were given:   Surgical Site Infection Prevention   __X__ Take these medicines the morning of surgery with A SIP OF WATER:    1. PANTOPRAZOLE AT BEDTIME 09/06/15 AND AM SURGERY  2.   3.   4.  5.  6.  ____ Fleet Enema (as directed)   _X___ Use CHG Soap as directed  ____ Use inhalers on the day of surgery  ____ Stop metformin 2 days prior to surgery    ____ Take 1/2 of usual insulin dose the  night before surgery and none on the morning of surgery.   _X___ Stop Coumadin/Plavix/aspirin on    STOP ASPIRIN UNTIL AFTER SURGERY  ____ Stop Anti-inflammatories on    ____ Stop supplements until after surgery.    ____ Bring C-Pap to the hospital.

## 2015-08-31 ENCOUNTER — Ambulatory Visit (INDEPENDENT_AMBULATORY_CARE_PROVIDER_SITE_OTHER): Payer: Managed Care, Other (non HMO) | Admitting: Family Medicine

## 2015-08-31 ENCOUNTER — Encounter: Payer: Self-pay | Admitting: Family Medicine

## 2015-08-31 VITALS — BP 128/78 | HR 99 | Temp 98.5°F | Resp 16 | Ht 65.0 in | Wt 216.6 lb

## 2015-08-31 DIAGNOSIS — Z01818 Encounter for other preprocedural examination: Secondary | ICD-10-CM | POA: Insufficient documentation

## 2015-08-31 NOTE — Progress Notes (Signed)
Name: Teresa Guerrero   MRN: [id number]    DOB: 01/31/1966   Date:08/31/2015       Progress Note  Subjective  Chief Complaint  Chief Complaint  Patient presents with  . Follow-up    Surgicial clearance    HPI  Pt. Presents for surgical clearance for 2nd left metatarsal osteotomy due to osteoarthritis. Surgery is scheduled for July 7th, 2017 and will be local anesthesia under sedation. Pt. Had multiple previous surgeries, no complications from surgery but has history of nausea during general anesthesia in the past, successfully treated with prophylactic anti-emetics. No history of bleeding or recent infections. She has been admitted to the hospital in 2012 for fast heart beat and was diagnosed with benign tachycardia.    Past Medical History  Diagnosis Date  . GERD (gastroesophageal reflux disease)   . Climacteric syndrome   . Allergic rhinitis   . Asthma   . Diverticulosis   . IBS (irritable bowel syndrome)   . Dysrhythmia     tachycardia/palpitations  . History of hiatal hernia   . Arthritis   . Complication of anesthesia   . PONV (postoperative nausea and vomiting)     Past Surgical History  Procedure Laterality Date  . Cesarean section  1994  . Abdominal hysterectomy  2012    Menorrhagia, No cervix, No ovaries  . Endometrial ablation  2009  . Appendectomy    . Cholecystectomy    . Knee arthroscopy      right knee   . Breast biopsy Left 2013    normal results  . Cervical fusion      Family History  Problem Relation Age of Onset  . Diabetes Mother   . Thyroid disease Mother   . Hypertension Mother   . Heart disease Mother   . Hypertension Father   . Stroke Father   . Heart disease Father   . Colon cancer Brother     Social History   Social History  . Marital Status: Married    Spouse Name: N/A  . Number of Children: N/A  . Years of Education: N/A   Occupational History  . Not on file.   Social History Main Topics  . Smoking status: Never Smoker     . Smokeless tobacco: Never Used  . Alcohol Use: 4.8 oz/week    8 Glasses of wine per week  . Drug Use: No  . Sexual Activity: Yes    Birth Control/ Protection: Surgical   Other Topics Concern  . Not on file   Social History Narrative     Current outpatient prescriptions:  .  Cholecalciferol (D 1000) 1000 units capsule, Take 1,000 Units by mouth daily. , Disp: , Rfl:  .  desloratadine (CLARINEX) 5 MG tablet, Take 5 mg by mouth as needed. Reported on 07/16/2015, Disp: , Rfl:  .  estropipate (OGEN) 0.75 MG tablet, Take 1 tablet (0.75 mg total) by mouth daily., Disp: 30 tablet, Rfl: 11 .  Multiple Vitamin (MULTIVITAMIN) tablet, Take 1 tablet by mouth daily., Disp: , Rfl:  .  pantoprazole (PROTONIX) 40 MG tablet, Take 40 mg by mouth daily. , Disp: , Rfl:  .  aspirin 81 MG tablet, Take 81 mg by mouth daily. Reported on 08/31/2015, Disp: , Rfl:   Allergies  Allergen Reactions  . Formaldehyde Hives and Rash  . Kefzol [Cefazolin] Anaphylaxis and Cough    Other reaction(s): Other (See Comments)  . Penicillin G Anaphylaxis    Has patient had a PCN  reaction causing immediate rash, facial/tongue/throat swelling, SOB or lightheadedness with hypotension: Yes Has patient had a PCN reaction causing severe rash involving mucus membranes or skin necrosis: No Has patient had a PCN reaction that required hospitalization No Has patient had a PCN reaction occurring within the last 10 years: Yes If all of the above answers are "NO", then may proceed with Cephalosporin use.   Marland Kitchen. Penicillins Anaphylaxis  . Prednisone Anaphylaxis  . Azithromycin Rash  . Ciprofloxacin Rash     Review of Systems  Constitutional: Negative for fever and chills.  Respiratory: Negative for cough.   Cardiovascular: Negative for chest pain.  Gastrointestinal: Negative for nausea and vomiting.  Genitourinary: Negative for hematuria.  Musculoskeletal: Positive for joint pain.    Objective  Filed Vitals:   08/31/15  0945  BP: 128/78  Pulse: 99  Temp: 98.5 F (36.9 C)  TempSrc: Oral  Resp: 16  Height: 5\' 5"  (1.651 m)  Weight: 216 lb 9.6 oz (98.249 kg)  SpO2: 97%    Physical Exam  Constitutional: She is oriented to person, place, and time and well-developed, well-nourished, and in no distress.  Neck: Normal range of motion. Neck supple. No thyroid mass present.  Cardiovascular: Normal rate, regular rhythm and normal heart sounds.   No murmur heard. Pulmonary/Chest: Effort normal and breath sounds normal. She has no wheezes.  Abdominal: Soft. Bowel sounds are normal. There is no tenderness.  Musculoskeletal:       Left foot: There is tenderness.       Feet:  Tenderness to palpation and limited ROM of left 2nd metatarsal  Neurological: She is alert and oriented to person, place, and time.  Skin: Skin is warm and dry.  Nursing note and vitals reviewed.     Assessment & Plan  1. Preoperative clearance He shouldn't has been cleared for the planned surgery after review of pertinent history and examination. paperwork completed and provided to the patient   Kathi LudwigSyed Asad A. Faylene KurtzShah [facility] 08/31/2015 10:05 AM

## 2015-09-01 HISTORY — PX: FOOT SURGERY: SHX648

## 2015-09-07 ENCOUNTER — Encounter: Payer: Self-pay | Admitting: Anesthesiology

## 2015-09-07 ENCOUNTER — Ambulatory Visit: Payer: Managed Care, Other (non HMO) | Admitting: Anesthesiology

## 2015-09-07 ENCOUNTER — Encounter
Admission: RE | Disposition: A | Discharge status: Home / Self Care | Payer: Self-pay | Source: Ambulatory Visit | Attending: Podiatry

## 2015-09-07 ENCOUNTER — Ambulatory Visit
Admission: RE | Admit: 2015-09-07 | Discharge: 2015-09-07 | Disposition: A | Discharge status: Home / Self Care | Payer: Managed Care, Other (non HMO) | Source: Ambulatory Visit | Attending: Podiatry | Admitting: Podiatry

## 2015-09-07 DIAGNOSIS — Z79899 Other long term (current) drug therapy: Secondary | ICD-10-CM | POA: Insufficient documentation

## 2015-09-07 DIAGNOSIS — Z9049 Acquired absence of other specified parts of digestive tract: Secondary | ICD-10-CM | POA: Insufficient documentation

## 2015-09-07 DIAGNOSIS — M7752 Other enthesopathy of left foot: Secondary | ICD-10-CM | POA: Diagnosis present

## 2015-09-07 DIAGNOSIS — Z881 Allergy status to other antibiotic agents status: Secondary | ICD-10-CM | POA: Insufficient documentation

## 2015-09-07 DIAGNOSIS — Z880 Allergy status to penicillin: Secondary | ICD-10-CM | POA: Diagnosis not present

## 2015-09-07 DIAGNOSIS — J45909 Unspecified asthma, uncomplicated: Secondary | ICD-10-CM | POA: Insufficient documentation

## 2015-09-07 DIAGNOSIS — Z7982 Long term (current) use of aspirin: Secondary | ICD-10-CM | POA: Insufficient documentation

## 2015-09-07 DIAGNOSIS — M778 Other enthesopathies, not elsewhere classified: Secondary | ICD-10-CM | POA: Diagnosis not present

## 2015-09-07 DIAGNOSIS — Z888 Allergy status to other drugs, medicaments and biological substances status: Secondary | ICD-10-CM | POA: Insufficient documentation

## 2015-09-07 DIAGNOSIS — K449 Diaphragmatic hernia without obstruction or gangrene: Secondary | ICD-10-CM | POA: Diagnosis not present

## 2015-09-07 DIAGNOSIS — Z90710 Acquired absence of both cervix and uterus: Secondary | ICD-10-CM | POA: Insufficient documentation

## 2015-09-07 DIAGNOSIS — Z8249 Family history of ischemic heart disease and other diseases of the circulatory system: Secondary | ICD-10-CM | POA: Diagnosis not present

## 2015-09-07 DIAGNOSIS — Z833 Family history of diabetes mellitus: Secondary | ICD-10-CM | POA: Insufficient documentation

## 2015-09-07 DIAGNOSIS — E669 Obesity, unspecified: Secondary | ICD-10-CM | POA: Insufficient documentation

## 2015-09-07 DIAGNOSIS — M1990 Unspecified osteoarthritis, unspecified site: Secondary | ICD-10-CM | POA: Diagnosis not present

## 2015-09-07 DIAGNOSIS — K219 Gastro-esophageal reflux disease without esophagitis: Secondary | ICD-10-CM | POA: Diagnosis not present

## 2015-09-07 DIAGNOSIS — Z823 Family history of stroke: Secondary | ICD-10-CM | POA: Insufficient documentation

## 2015-09-07 HISTORY — PX: METATARSAL OSTEOTOMY: SHX1641

## 2015-09-07 SURGERY — OSTEOTOMY, METATARSAL BONE
Anesthesia: General | Site: Foot | Laterality: Left | Wound class: Clean

## 2015-09-07 MED ORDER — FENTANYL CITRATE (PF) 100 MCG/2ML IJ SOLN: 25.0000 ug | INTRAMUSCULAR | Status: DC | PRN

## 2015-09-07 MED ORDER — ONDANSETRON HCL 4 MG/2ML IJ SOLN
INTRAMUSCULAR | Status: DC | PRN
  Administered 2015-09-07: 8 mg via INTRAVENOUS

## 2015-09-07 MED ORDER — PROPOFOL 10 MG/ML IV BOLUS
INTRAVENOUS | Status: DC | PRN
  Administered 2015-09-07: 140 mg via INTRAVENOUS

## 2015-09-07 MED ORDER — LIDOCAINE HCL (CARDIAC) 20 MG/ML IV SOLN
INTRAVENOUS | Status: DC | PRN
  Administered 2015-09-07: 100 mg via INTRAVENOUS

## 2015-09-07 MED ORDER — OXYCODONE-ACETAMINOPHEN 5-325 MG PO TABS: 1.0000 | ORAL_TABLET | ORAL | Status: DC | PRN

## 2015-09-07 MED ORDER — FENTANYL CITRATE (PF) 100 MCG/2ML IJ SOLN
INTRAMUSCULAR | Status: DC | PRN
  Administered 2015-09-07: 25 ug via INTRAVENOUS
  Administered 2015-09-07: 50 ug via INTRAVENOUS

## 2015-09-07 MED ORDER — MIDAZOLAM HCL 2 MG/2ML IJ SOLN
INTRAMUSCULAR | Status: DC | PRN
  Administered 2015-09-07: 2 mg via INTRAVENOUS

## 2015-09-07 MED ORDER — CLINDAMYCIN PHOSPHATE 900 MG/50ML IV SOLN
INTRAVENOUS | Status: AC
  Filled 2015-09-07: qty 50

## 2015-09-07 MED ORDER — LIDOCAINE HCL (PF) 1 % IJ SOLN
INTRAMUSCULAR | Status: AC
  Filled 2015-09-07: qty 30

## 2015-09-07 MED ORDER — CLINDAMYCIN PHOSPHATE 900 MG/50ML IV SOLN
900.0000 mg | Freq: Once | INTRAVENOUS | Status: AC
  Administered 2015-09-07: 900 mg via INTRAVENOUS

## 2015-09-07 MED ORDER — BUPIVACAINE HCL (PF) 0.5 % IJ SOLN
INTRAMUSCULAR | Status: DC | PRN
  Administered 2015-09-07: 10 mL

## 2015-09-07 MED ORDER — ONDANSETRON HCL 4 MG/2ML IJ SOLN: 4.0000 mg | Freq: Once | INTRAMUSCULAR | Status: DC | PRN

## 2015-09-07 MED ORDER — EPHEDRINE SULFATE 50 MG/ML IJ SOLN
INTRAMUSCULAR | Status: DC | PRN
  Administered 2015-09-07: 10 mg via INTRAVENOUS

## 2015-09-07 MED ORDER — BUPIVACAINE HCL (PF) 0.5 % IJ SOLN
INTRAMUSCULAR | Status: AC
  Filled 2015-09-07: qty 30

## 2015-09-07 MED ORDER — LACTATED RINGERS IV SOLN
INTRAVENOUS | Status: DC
  Administered 2015-09-07: 07:00:00 via INTRAVENOUS

## 2015-09-07 SURGICAL SUPPLY — 46 items
BAG COUNTER SPONGE EZ (MISCELLANEOUS) IMPLANT
BANDAGE STRETCH 3X4.1 STRL (GAUZE/BANDAGES/DRESSINGS) ×3 IMPLANT
BLADE OSC/SAGITTAL MD 5.5X18 (BLADE) ×3 IMPLANT
BNDG ESMARK 4X12 TAN STRL LF (GAUZE/BANDAGES/DRESSINGS) ×3 IMPLANT
BNDG GAUZE 4.5X4.1 6PLY STRL (MISCELLANEOUS) IMPLANT
BUR EGG 5.0 (BLADE) ×2 IMPLANT
BUR EGG 5.0MM (BLADE) ×1
CLOSURE WOUND 1/4X4 (GAUZE/BANDAGES/DRESSINGS) ×1
COUNTER SPONGE BAG EZ (MISCELLANEOUS)
CUFF TOURN 18 STER (MISCELLANEOUS) ×3 IMPLANT
DRAPE FLUOR MINI C-ARM 54X84 (DRAPES) ×3 IMPLANT
DRSG TEGADERM 4X4.75 (GAUZE/BANDAGES/DRESSINGS) ×3 IMPLANT
DURAPREP 26ML APPLICATOR (WOUND CARE) ×6 IMPLANT
ELECT REM PT RETURN 9FT ADLT (ELECTROSURGICAL) ×3
ELECTRODE REM PT RTRN 9FT ADLT (ELECTROSURGICAL) ×1 IMPLANT
GAUZE PETRO XEROFOAM 1X8 (MISCELLANEOUS) ×3 IMPLANT
GAUZE SPONGE 4X4 12PLY STRL (GAUZE/BANDAGES/DRESSINGS) ×3 IMPLANT
GAUZE STRETCH 2X75IN STRL (MISCELLANEOUS) ×6 IMPLANT
GLOVE BIO SURGEON STRL SZ7.5 (GLOVE) ×9 IMPLANT
GLOVE INDICATOR 8.0 STRL GRN (GLOVE) ×6 IMPLANT
GOWN STRL REUS W/ TWL LRG LVL3 (GOWN DISPOSABLE) ×3 IMPLANT
GOWN STRL REUS W/TWL LRG LVL3 (GOWN DISPOSABLE) ×6
K-WIRE 0.8X100 (WIRE) ×12
KIT RM TURNOVER STRD PROC AR (KITS) ×3 IMPLANT
KWIRE 0.8X100 (WIRE) ×4 IMPLANT
LABEL OR SOLS (LABEL) IMPLANT
NDL SAFETY 18GX1.5 (NEEDLE) ×3 IMPLANT
NEEDLE FILTER BLUNT 18X 1/2SAF (NEEDLE) ×2
NEEDLE FILTER BLUNT 18X1 1/2 (NEEDLE) ×1 IMPLANT
NEEDLE HYPO 25X1 1.5 SAFETY (NEEDLE) ×9 IMPLANT
NS IRRIG 500ML POUR BTL (IV SOLUTION) ×3 IMPLANT
PACK EXTREMITY ARMC (MISCELLANEOUS) ×3 IMPLANT
PAD CAST CTTN 4X4 STRL (SOFTGOODS) ×1 IMPLANT
PADDING CAST COTTON 4X4 STRL (SOFTGOODS) ×2
PENCIL ELECTRO HAND CTR (MISCELLANEOUS) ×3 IMPLANT
SCREW COMP SH THR 18MM (Screw) ×3 IMPLANT
SPLINT FAST PLASTER 5X30 (CAST SUPPLIES) ×30
SPLINT PLASTER CAST FAST 5X30 (CAST SUPPLIES) ×15 IMPLANT
STOCKINETTE M/LG 89821 (MISCELLANEOUS) ×3 IMPLANT
STRIP CLOSURE SKIN 1/4X4 (GAUZE/BANDAGES/DRESSINGS) ×2 IMPLANT
SUT VIC AB 4-0 FS2 27 (SUTURE) ×3 IMPLANT
SUT VICRYL AB 3-0 FS1 BRD 27IN (SUTURE) ×3 IMPLANT
SWABSTK COMLB BENZOIN TINCTURE (MISCELLANEOUS) ×3 IMPLANT
SYRINGE 10CC LL (SYRINGE) ×3 IMPLANT
WIRE Z .045 C-WIRE SPADE TIP (WIRE) ×6 IMPLANT
kwire 0.8x100 ×3 IMPLANT

## 2015-09-07 NOTE — Anesthesia Postprocedure Evaluation (Signed)
Anesthesia Post Note  Patient: Teresa Guerrero  Procedure(s) Performed: Procedure(s) (LRB): METATARSAL OSTEOTOMY/2nd met left (Left)  Patient location during evaluation: PACU Anesthesia Type: General Level of consciousness: awake and alert Pain management: pain level controlled Vital Signs Assessment: post-procedure vital signs reviewed and stable Respiratory status: spontaneous breathing, nonlabored ventilation, respiratory function stable and patient connected to nasal cannula oxygen Cardiovascular status: blood pressure returned to baseline and stable Postop Assessment: no signs of nausea or vomiting Anesthetic complications: no    Last Vitals:  Filed Vitals:   09/07/15 0948 09/07/15 1001  BP: 130/77 129/80  Pulse: 90 84  Temp:  36.6 C  Resp: 16 15    Last Pain:  Filed Vitals:   09/07/15 1002  PainSc: 0-No pain                 Dieter Hane S

## 2015-09-07 NOTE — Anesthesia Preprocedure Evaluation (Addendum)
Anesthesia Evaluation  Patient identified by MRN, date of birth, ID band Patient awake    Reviewed: Allergy & Precautions, NPO status , Patient's Chart, lab work & pertinent test results, reviewed documented beta blocker date and time   History of Anesthesia Complications (+) PONV and history of anesthetic complications  Airway Mallampati: III  TM Distance: >3 FB     Dental  (+) Chipped   Pulmonary asthma ,           Cardiovascular + dysrhythmias      Neuro/Psych    GI/Hepatic hiatal hernia, GERD  Controlled,  Endo/Other    Renal/GU      Musculoskeletal  (+) Arthritis ,   Abdominal   Peds  Hematology   Anesthesia Other Findings Obese. PVCs. IBS. Had a cardiologist consult last week - OK. Echo in the past OK. Cervical fusion, but good neck movement.  Reproductive/Obstetrics                            Anesthesia Physical Anesthesia Plan  ASA: III  Anesthesia Plan: MAC   Post-op Pain Management:    Induction: Intravenous  Airway Management Planned:   Additional Equipment:   Intra-op Plan:   Post-operative Plan:   Informed Consent: I have reviewed the patients History and Physical, chart, labs and discussed the procedure including the risks, benefits and alternatives for the proposed anesthesia with the patient or authorized representative who has indicated his/her understanding and acceptance.     Plan Discussed with: CRNA  Anesthesia Plan Comments:         Anesthesia Quick Evaluation

## 2015-09-07 NOTE — Op Note (Signed)
Date of operation: 09/07/2015.  Surgeon: Ricci Barkerodd W Joseff Luckman DPM.  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  Postoperative diagnosis: Same.  Procedure: Shortening osteotomy left second metatarsal with debridement of arthritic changes at the joint.  Anesthesia: LMA with local.  Hemostasis: Pneumatic tourniquet left ankle 250 mmHg.  Estimated blood loss: Less than 5 cc.  Pathology: None.  Materials: One 18 mm length 2.2 mm Medartis compression screw. 0.045 inch K wire.  Complications: None apparent.  Operative indications: This is a 50 year old female with a chronic history of some pain in her left second toe joint area. Patient has tried conservative treatment and elects to have surgical correction and debridement of the joint.  Operative procedure: Patient was taken to the operating room and placed on the table in the supine position. Following satisfactory LMA anesthesia the left foot was anesthetized with 10 cc of 0.5% bupivacaine plain around the second metatarsal. A pneumatic tourniquet was applied at the level of the left ankle and the foot was prepped and draped in the usual sterile fashion. The foot was exsanguinated and the tourniquet inflated to 250 mmHg. Attention was then directed to the dorsal aspect of the left foot where an approximate 4 cm linear incision with a small lazy S over the second metatarsophalangeal joint area was made. Incision was deepened via sharp and blunt dissection down to the level of the joint where a linear capsulotomy and periosteal incision was made. Capsular and periosteal tissues reflected off of the head of the second metatarsal and base of the proximal phalanx. Some significant dorsal and lateral spurring was noted. There was also noted to be an approximate 3-4 mm diameter articular defect in the cartilage at the distal aspect of the second metatarsal. Using a ronguer and pneumatic saw  the dorsal exostosis as well as the prominence is an exostosis laterally were debrided and removed. An osteotomy was then performed through the second metatarsal from dorsal distal to plantar proximal and the head of the metatarsal was freely mobilized. The capital fragment was shortened slightly and displaced medially to facilitate better alignment of the second toe. A wire from the Medartis set was then placed across the osteotomy and intraoperative FluoroScan views revealed good metatarsal parabola and alignment of the second metatarsal. A 2.2 mm Medartis compression screw, 18 mm length, was then inserted in standard fashion. A 0.045 inch K wire was driven through the distal aspect of the second toe through the distal middle and proximal phalanx and then retrograded into the second metatarsal. Intraoperative FluoroScan views revealed good alignment of the osteotomy and second toe with stable fixation. The wound was then flushed with copious amounts of sterile saline and closed using 4-0 Vicryl running suture for all layers from periosteal and capsular closure to deep and superficial subcutaneous followed by skin closure. Tincture of benzoin and Steri-Strips applied across the incision followed by Xeroform and a sterile bandage. The tourniquet was released and blood flow noted to return immediately to all digits. A posterior splint of plaster was then applied to the left lower extremity with the foot at a 90 angle relative to the leg. The patient was awakened and transported to the PACU with vital signs stable and in good condition.

## 2015-09-07 NOTE — Transfer of Care (Signed)
Immediate Anesthesia Transfer of Care Note  Patient: Teresa Guerrero  Procedure(s) Performed: Procedure(s): METATARSAL OSTEOTOMY/2nd met left (Left)  Patient Location: PACU  Anesthesia Type:General  Level of Consciousness: awake, alert , oriented and patient cooperative  Airway & Oxygen Therapy: Patient Spontanous Breathing and Patient connected to face mask oxygen  Post-op Assessment: Report given to RN, Post -op Vital signs reviewed and stable and Patient moving all extremities X 4  Post vital signs: Reviewed and stable  Last Vitals:  Filed Vitals:   09/07/15 0616 09/07/15 0918  BP: 137/97 132/80  Pulse: 74 109  Temp: 36.7 C 36.5 C  Resp: 18 14    Last Pain:  Filed Vitals:   09/07/15 0919  PainSc: 5          Complications: No apparent anesthesia complications

## 2015-09-07 NOTE — Anesthesia Procedure Notes (Signed)
Procedure Name: LMA Insertion Date/Time: 09/07/2015 7:31 AM Performed by: Michaele OfferSAVAGE, Aoi Kouns Pre-anesthesia Checklist: Patient identified, Emergency Drugs available, Suction available, Patient being monitored and Timeout performed Patient Re-evaluated:Patient Re-evaluated prior to inductionOxygen Delivery Method: Circle system utilized Preoxygenation: Pre-oxygenation with 100% oxygen Intubation Type: IV induction Ventilation: Mask ventilation without difficulty LMA: LMA inserted LMA Size: 4.0 Number of attempts: 1 Placement Confirmation: positive ETCO2 and breath sounds checked- equal and bilateral Tube secured with: Tape Dental Injury: Teeth and Oropharynx as per pre-operative assessment

## 2015-09-07 NOTE — Interval H&P Note (Signed)
History and Physical Interval Note:  09/07/2015 7:17 AM  Teresa Guerrero  has presented today for surgery, with the diagnosis of exostosis M89.8x9 Capsulitis M77.52  The various methods of treatment have been discussed with the patient and family. After consideration of risks, benefits and other options for treatment, the patient has consented to  Procedure(s): METATARSAL OSTEOTOMY/2nd met left (Left) as a surgical intervention .  The patient's history has been reviewed, patient examined, no change in status, stable for surgery.  I have reviewed the patient's chart and labs.  Questions were answered to the patient's satisfaction.     Durward Fortes

## 2015-09-07 NOTE — H&P (Signed)
  History and physical in the chart was reviewed. No interval changes. Patient stable for surgery 

## 2015-09-07 NOTE — Discharge Instructions (Addendum)
1. Elevate left lower extremity on 2 pillows.  2. Keep the bandage on the left foot clean, dry, and do not remove.  3. Sponge bathe only left lower extremity.  4. Wear surgical shoe on the left foot whenever walking or standing.  5. Take one pain pill, Percocet, every 4 hours if needed for pain   AMBULATORY SURGERY  DISCHARGE INSTRUCTIONS   1) The drugs that you were given will stay in your system until tomorrow so for the next 24 hours you should not:  A) Drive an automobile B) Make any legal decisions C) Drink any alcoholic beverage   2) You may resume regular meals tomorrow.  Today it is better to start with liquids and gradually work up to solid foods.  You may eat anything you prefer, but it is better to start with liquids, then soup and crackers, and gradually work up to solid foods.   3) Please notify your doctor immediately if you have any unusual bleeding, trouble breathing, redness and pain at the surgery site, drainage, fever, or pain not relieved by medication.    4) Additional Instructions:        Please contact your physician with any problems or Same Day Surgery at [phone], Monday through Friday 6 am to 4 pm, or [place] at [facility] Main number at [phone].

## 2015-09-18 ENCOUNTER — Encounter: Payer: Self-pay | Admitting: Obstetrics and Gynecology

## 2015-09-18 ENCOUNTER — Ambulatory Visit (INDEPENDENT_AMBULATORY_CARE_PROVIDER_SITE_OTHER): Payer: Managed Care, Other (non HMO) | Admitting: Obstetrics and Gynecology

## 2015-09-18 VITALS — BP 114/78 | HR 89 | Ht 65.0 in | Wt 218.0 lb

## 2015-09-18 DIAGNOSIS — Z79890 Hormone replacement therapy (postmenopausal): Secondary | ICD-10-CM

## 2015-09-18 NOTE — Progress Notes (Signed)
   GYNECOLOGY CLINIC PROGRESS NOTE  Subjective:   HPI:   Teresa Guerrero is a 50 y.o. 411P1001 female who presents for f/u of HRT.  Has been on HRT for ~ 6 weeks. Patient was resumed on HRT after being off for over 1 year due to recurrence of symptoms (vasomotor symptoms, memory loss with "fogginess".  Currently denies any concerning side effects. Notes that symptoms have almost completely resolved.   Current hormone therapy:  Estrogen: Ogen 0.75 mg daily     The following portions of the patient's history were reviewed and updated as appropriate: allergies, current medications, past family history, past medical history, past social history, past surgical history and problem list.  Review of Systems A comprehensive review of systems was negative.    Objective:     BP 114/78 mmHg  Pulse 89  Ht 5\' 5"  (1.651 m)  Wt 218 lb (98.884 kg)  BMI 36.28 kg/m2  LMP 03/03/2010 No exam performed today.    Assessment:    Hormone replacement therapy in 50 y.o. woman.    Plan:   - Patient continuing HRT.  Risks and benefits of HRT, including recent evidence on HRT effects on breast cancer and heart disease, were again discussed.  Discussed using lowest effective dose for shortest time period. - Follow up in 2 months for annual exam.     Hildred LaserAnika Ailanie Ruttan, MD [facility]

## 2015-11-08 ENCOUNTER — Ambulatory Visit (INDEPENDENT_AMBULATORY_CARE_PROVIDER_SITE_OTHER): Payer: Managed Care, Other (non HMO) | Admitting: Obstetrics and Gynecology

## 2015-11-08 ENCOUNTER — Encounter: Payer: Self-pay | Admitting: Obstetrics and Gynecology

## 2015-11-08 VITALS — BP 131/82 | HR 73 | Ht 65.0 in | Wt 219.8 lb

## 2015-11-08 DIAGNOSIS — Z01419 Encounter for gynecological examination (general) (routine) without abnormal findings: Secondary | ICD-10-CM

## 2015-11-08 DIAGNOSIS — E669 Obesity, unspecified: Secondary | ICD-10-CM | POA: Diagnosis not present

## 2015-11-08 DIAGNOSIS — Z79890 Hormone replacement therapy (postmenopausal): Secondary | ICD-10-CM | POA: Diagnosis not present

## 2015-11-08 DIAGNOSIS — Z1239 Encounter for other screening for malignant neoplasm of breast: Secondary | ICD-10-CM

## 2015-11-08 NOTE — Patient Instructions (Signed)
Health Maintenance, Female Adopting a healthy lifestyle and getting preventive care can go a long way to promote health and wellness. Talk with your health care provider about what schedule of regular examinations is right for you. This is a good chance for you to check in with your provider about disease prevention and staying healthy. In between checkups, there are plenty of things you can do on your own. Experts have done a lot of research about which lifestyle changes and preventive measures are most likely to keep you healthy. Ask your health care provider for more information. WEIGHT AND DIET  Eat a healthy diet  Be sure to include plenty of vegetables, fruits, low-fat dairy products, and lean protein.  Do not eat a lot of foods high in solid fats, added sugars, or salt.  Get regular exercise. This is one of the most important things you can do for your health.  Most adults should exercise for at least 150 minutes each week. The exercise should increase your heart rate and make you sweat (moderate-intensity exercise).  Most adults should also do strengthening exercises at least twice a week. This is in addition to the moderate-intensity exercise.  Maintain a healthy weight  Body mass index (BMI) is a measurement that can be used to identify possible weight problems. It estimates body fat based on height and weight. Your health care provider can help determine your BMI and help you achieve or maintain a healthy weight.  For females 20 years of age and older:   A BMI below 18.5 is considered underweight.  A BMI of 18.5 to 24.9 is normal.  A BMI of 25 to 29.9 is considered overweight.  A BMI of 30 and above is considered obese.  Watch levels of cholesterol and blood lipids  You should start having your blood tested for lipids and cholesterol at 51 years of age, then have this test every 5 years.  You may need to have your cholesterol levels checked more often if:  Your lipid  or cholesterol levels are high.  You are older than 50 years of age.  You are at high risk for heart disease.  CANCER SCREENING   Lung Cancer  Lung cancer screening is recommended for adults 55-80 years old who are at high risk for lung cancer because of a history of smoking.  A yearly low-dose CT scan of the lungs is recommended for people who:  Currently smoke.  Have quit within the past 15 years.  Have at least a 30-pack-year history of smoking. A pack year is smoking an average of one pack of cigarettes a day for 1 year.  Yearly screening should continue until it has been 15 years since you quit.  Yearly screening should stop if you develop a health problem that would prevent you from having lung cancer treatment.  Breast Cancer  Practice breast self-awareness. This means understanding how your breasts normally appear and feel.  It also means doing regular breast self-exams. Let your health care provider know about any changes, no matter how small.  If you are in your 20s or 30s, you should have a clinical breast exam (CBE) by a health care provider every 1-3 years as part of a regular health exam.  If you are 40 or older, have a CBE every year. Also consider having a breast X-ray (mammogram) every year.  If you have a family history of breast cancer, talk to your health care provider about genetic screening.  If you   are at high risk for breast cancer, talk to your health care provider about having an MRI and a mammogram every year.  Breast cancer gene (BRCA) assessment is recommended for women who have family members with BRCA-related cancers. BRCA-related cancers include:  Breast.  Ovarian.  Tubal.  Peritoneal cancers.  Results of the assessment will determine the need for genetic counseling and BRCA1 and BRCA2 testing. Cervical Cancer Your health care provider may recommend that you be screened regularly for cancer of the pelvic organs (ovaries, uterus, and  vagina). This screening involves a pelvic examination, including checking for microscopic changes to the surface of your cervix (Pap test). You may be encouraged to have this screening done every 3 years, beginning at age 21.  For women ages 30-65, health care providers may recommend pelvic exams and Pap testing every 3 years, or they may recommend the Pap and pelvic exam, combined with testing for human papilloma virus (HPV), every 5 years. Some types of HPV increase your risk of cervical cancer. Testing for HPV may also be done on women of any age with unclear Pap test results.  Other health care providers may not recommend any screening for nonpregnant women who are considered low risk for pelvic cancer and who do not have symptoms. Ask your health care provider if a screening pelvic exam is right for you.  If you have had past treatment for cervical cancer or a condition that could lead to cancer, you need Pap tests and screening for cancer for at least 20 years after your treatment. If Pap tests have been discontinued, your risk factors (such as having a new sexual partner) need to be reassessed to determine if screening should resume. Some women have medical problems that increase the chance of getting cervical cancer. In these cases, your health care provider may recommend more frequent screening and Pap tests. Colorectal Cancer  This type of cancer can be detected and often prevented.  Routine colorectal cancer screening usually begins at 50 years of age and continues through 51 years of age.  Your health care provider may recommend screening at an earlier age if you have risk factors for colon cancer.  Your health care provider may also recommend using home test kits to check for hidden blood in the stool.  A small camera at the end of a tube can be used to examine your colon directly (sigmoidoscopy or colonoscopy). This is done to check for the earliest forms of colorectal  cancer.  Routine screening usually begins at age 50.  Direct examination of the colon should be repeated every 5-10 years through 51 years of age. However, you may need to be screened more often if early forms of precancerous polyps or small growths are found. Skin Cancer  Check your skin from head to toe regularly.  Tell your health care provider about any new moles or changes in moles, especially if there is a change in a mole's shape or color.  Also tell your health care provider if you have a mole that is larger than the size of a pencil eraser.  Always use sunscreen. Apply sunscreen liberally and repeatedly throughout the day.  Protect yourself by wearing long sleeves, pants, a wide-brimmed hat, and sunglasses whenever you are outside. HEART DISEASE, DIABETES, AND HIGH BLOOD PRESSURE   High blood pressure causes heart disease and increases the risk of stroke. High blood pressure is more likely to develop in:  People who have blood pressure in the high end   of the normal range (130-139/85-89 mm Hg).  People who are overweight or obese.  People who are African American.  If you are 38-23 years of age, have your blood pressure checked every 3-5 years. If you are 61 years of age or older, have your blood pressure checked every year. You should have your blood pressure measured twice--once when you are at a hospital or clinic, and once when you are not at a hospital or clinic. Record the average of the two measurements. To check your blood pressure when you are not at a hospital or clinic, you can use:  An automated blood pressure machine at a pharmacy.  A home blood pressure monitor.  If you are between 45 years and 39 years old, ask your health care provider if you should take aspirin to prevent strokes.  Have regular diabetes screenings. This involves taking a blood sample to check your fasting blood sugar level.  If you are at a normal weight and have a low risk for diabetes,  have this test once every three years after 51 years of age.  If you are overweight and have a high risk for diabetes, consider being tested at a younger age or more often. PREVENTING INFECTION  Hepatitis B  If you have a higher risk for hepatitis B, you should be screened for this virus. You are considered at high risk for hepatitis B if:  You were born in a country where hepatitis B is common. Ask your health care provider which countries are considered high risk.  Your parents were born in a high-risk country, and you have not been immunized against hepatitis B (hepatitis B vaccine).  You have HIV or AIDS.  You use needles to inject street drugs.  You live with someone who has hepatitis B.  You have had sex with someone who has hepatitis B.  You get hemodialysis treatment.  You take certain medicines for conditions, including cancer, organ transplantation, and autoimmune conditions. Hepatitis C  Blood testing is recommended for:  Everyone born from [birth year] through 1966.  Anyone with known risk factors for hepatitis C. Sexually transmitted infections (STIs)  You should be screened for sexually transmitted infections (STIs) including gonorrhea and chlamydia if:  You are sexually active and are younger than 51 years of age.  You are older than 51 years of age and your health care provider tells you that you are at risk for this type of infection.  Your sexual activity has changed since you were last screened and you are at an increased risk for chlamydia or gonorrhea. Ask your health care provider if you are at risk.  If you do not have HIV, but are at risk, it may be recommended that you take a prescription medicine daily to prevent HIV infection. This is called pre-exposure prophylaxis (PrEP). You are considered at risk if:  You are sexually active and do not regularly use condoms or know the HIV status of your partner(s).  You take drugs by injection.  You are sexually  active with a partner who has HIV. Talk with your health care provider about whether you are at high risk of being infected with HIV. If you choose to begin PrEP, you should first be tested for HIV. You should then be tested every 3 months for as long as you are taking PrEP.  PREGNANCY   If you are premenopausal and you may become pregnant, ask your health care provider about preconception counseling.  If you may  become pregnant, take 400 to 800 micrograms (mcg) of folic acid every day.  If you want to prevent pregnancy, talk to your health care provider about birth control (contraception). OSTEOPOROSIS AND MENOPAUSE   Osteoporosis is a disease in which the bones lose minerals and strength with aging. This can result in serious bone fractures. Your risk for osteoporosis can be identified using a bone density scan.  If you are 61 years of age or older, or if you are at risk for osteoporosis and fractures, ask your health care provider if you should be screened.  Ask your health care provider whether you should take a calcium or vitamin D supplement to lower your risk for osteoporosis.  Menopause may have certain physical symptoms and risks.  Hormone replacement therapy may reduce some of these symptoms and risks. Talk to your health care provider about whether hormone replacement therapy is right for you.  HOME CARE INSTRUCTIONS   Schedule regular health, dental, and eye exams.  Stay current with your immunizations.   Do not use any tobacco products including cigarettes, chewing tobacco, or electronic cigarettes.  If you are pregnant, do not drink alcohol.  If you are breastfeeding, limit how much and how often you drink alcohol.  Limit alcohol intake to no more than 1 drink per day for nonpregnant women. One drink equals 12 ounces of beer, 5 ounces of wine, or 1 ounces of hard liquor.  Do not use street drugs.  Do not share needles.  Ask your health care provider for help if  you need support or information about quitting drugs.  Tell your health care provider if you often feel depressed.  Tell your health care provider if you have ever been abused or do not feel safe at home.   This information is not intended to replace advice given to you by your health care provider. Make sure you discuss any questions you have with your health care provider.   Document Released: 09/02/2010 Document Revised: 03/10/2014 Document Reviewed: 01/19/2013 Elsevier Interactive Patient Education [employer].

## 2015-11-08 NOTE — Progress Notes (Signed)
ANNUAL PREVENTATIVE CARE GYNECOLOGY  ENCOUNTER NOTE  Subjective:       Teresa Guerrero is a 50 y.o. G35P1001 female here for a routine annual gynecologic exam. The patient is sexually active (active). The patient is taking hormone replacement therapy. Patient denies post-menopausal vaginal bleeding. The patient wears seatbelts: yes. The patient participates in regular exercise: no. Has the patient ever been transfused or tattooed?: no. The patient reports that there is not domestic violence in her life. Current complaints: 1.  None    Gynecologic History Patient's last menstrual period was 03/03/2010.  Is s/p hysterectomy Contraception: post menopausal status Last Pap: 03/2010. Results were: normal Last mammogram: 10/2014. Results were: normal.  H/o 1 abnormal mammogram several years ago, had needle biopsy (left side, marker placed) Last Colonoscopy: has been within 5 years (cannot recall exact date).  Results were normal.    Obstetric History OB History  Gravida Para Term Preterm AB Living  '1 1 1     1  '$ SAB TAB Ectopic Multiple Live Births          1    # Outcome Date GA Lbr Len/2nd Weight Sex Delivery Anes PTL Lv  1 Term 06/19/92 [redacted]w[redacted]d 8 lb 12 oz (3.969 kg) F CS-Unspec   LIV      Past Medical History:  Diagnosis Date  . Allergic rhinitis   . Arthritis   . Asthma   . Climacteric syndrome   . Complication of anesthesia   . Diverticulosis   . Dysrhythmia    tachycardia/palpitations  . GERD (gastroesophageal reflux disease)   . History of hiatal hernia   . IBS (irritable bowel syndrome)   . PONV (postoperative nausea and vomiting)     Family History  Problem Relation Age of Onset  . Diabetes Mother   . Thyroid disease Mother   . Hypertension Mother   . Heart disease Mother   . Hypertension Father   . Stroke Father   . Heart disease Father   . Colon cancer Brother     Past Surgical History:  Procedure Laterality Date  . ABDOMINAL HYSTERECTOMY  2012   Menorrhagia,  No cervix, No ovaries  . APPENDECTOMY    . BREAST BIOPSY Left 2013   normal results  . CERVICAL FUSION    . CESAREAN SECTION  1994  . CHOLECYSTECTOMY    . ENDOMETRIAL ABLATION  2009  . FOOT SURGERY Left 09/01/2015  . KNEE ARTHROSCOPY     right knee   . METATARSAL OSTEOTOMY Left 09/07/2015   Procedure: METATARSAL OSTEOTOMY/2nd met left;  Surgeon: TSharlotte Alamo DPM;  Location: [facility] ORS;  Service: Podiatry;  Laterality: Left;    Social History   Social History  . Marital status: Married    Spouse name: N/A  . Number of children: N/A  . Years of education: N/A   Occupational History  . Not on file.   Social History Main Topics  . Smoking status: Never Smoker  . Smokeless tobacco: Never Used  . Alcohol use 4.8 oz/week    8 Glasses of wine per week  . Drug use: No  . Sexual activity: Yes    Birth control/ protection: Surgical   Other Topics Concern  . Not on file   Social History Narrative  . No narrative on file    Current Outpatient Prescriptions on File Prior to Visit  Medication Sig Dispense Refill  . aspirin 81 MG tablet Take 81 mg by mouth daily. Reported on 08/31/2015    .  Cholecalciferol (D 1000) 1000 units capsule Take 1,000 Units by mouth daily.     Marland Kitchen desloratadine (CLARINEX) 5 MG tablet Take 5 mg by mouth as needed. Reported on 07/16/2015    . estropipate (OGEN) 0.75 MG tablet Take 1 tablet (0.75 mg total) by mouth daily. 30 tablet 11  . Multiple Vitamin (MULTIVITAMIN) tablet Take 1 tablet by mouth daily.    . pantoprazole (PROTONIX) 40 MG tablet Take 40 mg by mouth daily.      No current facility-administered medications on file prior to visit.     Allergies  Allergen Reactions  . Formaldehyde Hives and Rash  . Kefzol [Cefazolin] Anaphylaxis and Cough    Other reaction(s): Other (See Comments)  . Penicillin G Anaphylaxis    Has patient had a PCN reaction causing immediate rash, facial/tongue/throat swelling, SOB or lightheadedness with hypotension: Yes Has  patient had a PCN reaction causing severe rash involving mucus membranes or skin necrosis: No Has patient had a PCN reaction that required hospitalization No Has patient had a PCN reaction occurring within the last 10 years: Yes If all of the above answers are "NO", then may proceed with Cephalosporin use.   Marland Kitchen Penicillins Anaphylaxis  . Prednisone Anaphylaxis  . Azithromycin Rash  . Ciprofloxacin Rash     Review of Systems ROS Review of Systems - General ROS: negative for - chills, fatigue, fever, hot flashes, night sweats, weight gain or weight loss Psychological ROS: negative for - anxiety, decreased libido, depression, mood swings, physical abuse or sexual abuse Ophthalmic ROS: negative for - blurry vision, eye pain or loss of vision ENT ROS: negative for - headaches, hearing change, visual changes or vocal changes Allergy and Immunology ROS: negative for - hives, itchy/watery eyes or seasonal allergies Hematological and Lymphatic ROS: negative for - bleeding problems, bruising, swollen lymph nodes or weight loss Endocrine ROS: negative for - galactorrhea, hair pattern changes, hot flashes, malaise/lethargy, mood swings, palpitations, polydipsia/polyuria, skin changes, temperature intolerance or unexpected weight changes Breast ROS: negative for - new or changing breast lumps or nipple discharge Respiratory ROS: negative for - cough or shortness of breath Cardiovascular ROS: negative for - chest pain, irregular heartbeat, palpitations or shortness of breath Gastrointestinal ROS: no abdominal pain, change in bowel habits, or black or bloody stools Genito-Urinary ROS: no dysuria, trouble voiding, or hematuria Musculoskeletal ROS: negative for - joint pain or joint stiffness Neurological ROS: negative for - bowel and bladder control changes Dermatological ROS: negative for rash and skin lesion changes   Objective:   BP 131/82 (BP Location: Left Arm, Patient Position: Sitting, Cuff  Size: Large)   Pulse 73   Ht '5\' 5"'$  (1.651 m)   Wt 219 lb 12.8 oz (99.7 kg)   LMP 03/03/2010   BMI 36.58 kg/m  CONSTITUTIONAL: Well-developed, well-nourished female in no acute distress. Moderate obesity PSYCHIATRIC: Normal mood and affect. Normal behavior. Normal judgment and thought content. [place]: Alert and oriented to person, place, and time. Normal muscle tone coordination. No cranial nerve deficit noted. HENT:  Normocephalic, atraumatic, External right and left ear normal. Oropharynx is clear and moist EYES: Conjunctivae and EOM are normal. Pupils are equal, round, and reactive to light. No scleral icterus.  NECK: Normal range of motion, supple, no masses.  Normal thyroid.  SKIN: Skin is warm and dry. No rash noted. Not diaphoretic. No erythema. No pallor. CARDIOVASCULAR: Normal heart rate noted, regular rhythm, no murmur. RESPIRATORY: Clear to auscultation bilaterally. Effort and breath sounds normal, no problems  with respiration noted. BREASTS: Symmetric in size. No masses, skin changes, nipple drainage, or lymphadenopathy. ABDOMEN: Soft, normal bowel sounds, no distention noted.  No tenderness, rebound or guarding.  BLADDER: Normal PELVIC:  Bladder no bladder distension noted  Urethra: normal appearing urethra with no masses, tenderness or lesions  Vulva: normal appearing vulva with no masses, tenderness or lesions  Vagina: normal appearing vagina with normal color and discharge, no lesions  Cervix: surgically absent  Uterus: surgically absent, vaginal cuff well healed  Adnexa: surgically absent bilateral  RV: External Exam NormaI, No Rectal Masses and Normal Sphincter tone  MUSCULOSKELETAL: Normal range of motion. No tenderness.  No cyanosis, clubbing, or edema.  2+ distal pulses. LYMPHATIC: No Axillary, Supraclavicular, or Inguinal Adenopathy.     Labs:  Lab Results  Component Value Date   WBC 8.3 08/30/2015   HGB 14.3 08/30/2015   HCT 42.2 08/30/2015   MCV 92.5  08/30/2015   PLT 181 08/30/2015      Chemistry      Component Value Date/Time   NA 135 06/08/2015 2123   K 3.8 08/30/2015 1300   CL 102 06/08/2015 2123   CO2 25 06/08/2015 2123   BUN 19 06/08/2015 2123   CREATININE 0.90 06/08/2015 2123      Component Value Date/Time   CALCIUM 9.7 06/08/2015 2123      Lab Results  Component Value Date   TSH 3.699 06/07/2015    Results for ERABELLA, KUIPERS (MRN [id number]) as of 11/10/2015 23:43  Ref. Range 06/08/2015 21:23  Glucose Latest Ref Range: 65 - 99 mg/dL 107 (H)    No results found for: CHOL, HDL, LDLCALC, LDLDIRECT, TRIG, CHOLHDL   Assessment:   Annual gynecologic examination 50 y.o. Contraception: status post hysterectomy Obesity (Class II) Postmenopausal, on HRT  Plan:  Pap: Not needed Mammogram: Ordered Stool Guaiac Testing:  Not Ordered.  Up to date with colonoscopy. Labs: None ordered Routine preventative health maintenance measures emphasized: Exercise/Diet/Weight control, Alcohol/Substance use risks and Stress Management Continue HRT, lowest dose for shortest time frame.  Resumed HRT 2 months ago after being off for several years. Will reassess symptoms next year.  Return to [place], [place]

## 2015-11-17 ENCOUNTER — Encounter: Payer: Self-pay | Admitting: Obstetrics and Gynecology

## 2015-11-18 ENCOUNTER — Encounter: Payer: Self-pay | Admitting: Obstetrics and Gynecology

## 2015-11-22 ENCOUNTER — Ambulatory Visit
Admission: RE | Admit: 2015-11-22 | Discharge: 2015-11-22 | Disposition: A | Discharge status: Home / Self Care | Payer: Managed Care, Other (non HMO) | Source: Ambulatory Visit | Attending: Obstetrics and Gynecology | Admitting: Obstetrics and Gynecology

## 2015-11-22 DIAGNOSIS — Z1231 Encounter for screening mammogram for malignant neoplasm of breast: Secondary | ICD-10-CM | POA: Diagnosis not present

## 2015-11-22 DIAGNOSIS — Z1239 Encounter for other screening for malignant neoplasm of breast: Secondary | ICD-10-CM | POA: Insufficient documentation

## 2015-11-29 ENCOUNTER — Other Ambulatory Visit: Payer: Self-pay | Admitting: *Deleted

## 2015-11-29 ENCOUNTER — Inpatient Hospital Stay
Admission: RE | Admit: 2015-11-29 | Discharge: 2015-11-29 | Disposition: A | Discharge status: Home / Self Care | Payer: Self-pay | Source: Ambulatory Visit | Attending: *Deleted | Admitting: *Deleted

## 2015-11-29 DIAGNOSIS — Z9289 Personal history of other medical treatment: Secondary | ICD-10-CM

## 2015-12-23 ENCOUNTER — Encounter: Payer: Self-pay | Admitting: Family Medicine

## 2015-12-26 ENCOUNTER — Other Ambulatory Visit: Payer: Self-pay | Admitting: Family Medicine

## 2015-12-26 ENCOUNTER — Encounter: Payer: Self-pay | Admitting: Family Medicine

## 2015-12-26 DIAGNOSIS — K219 Gastro-esophageal reflux disease without esophagitis: Secondary | ICD-10-CM

## 2015-12-26 MED ORDER — PANTOPRAZOLE SODIUM 40 MG PO TBEC: 40.0000 mg | DELAYED_RELEASE_TABLET | Freq: Every day | ORAL | 0 refills | Status: DC

## 2016-01-02 ENCOUNTER — Encounter: Payer: Self-pay | Admitting: Family Medicine

## 2016-05-05 ENCOUNTER — Encounter: Payer: Self-pay | Admitting: Obstetrics and Gynecology

## 2016-05-05 ENCOUNTER — Ambulatory Visit (INDEPENDENT_AMBULATORY_CARE_PROVIDER_SITE_OTHER): Payer: Commercial Managed Care - PPO | Admitting: Obstetrics and Gynecology

## 2016-05-05 VITALS — BP 121/77 | HR 79 | Wt 219.4 lb

## 2016-05-05 DIAGNOSIS — N644 Mastodynia: Secondary | ICD-10-CM | POA: Diagnosis not present

## 2016-05-05 NOTE — Progress Notes (Signed)
HPI:      [name] is a 51 y.o. G1P1001 who LMP was Patient's last menstrual period was 03/03/2010 (exact date).  Subjective:   She presents today with complaint of right breast pain that has been present for approximately 1 week. She has had a previous left needle biopsy-benign. No true family history of breast cancer[mother's half sister] She is taking estrogen daily for hot flashes and would like to continue.    Hx: The following portions of the patient's history were reviewed and updated as appropriate:              She  has a past medical history of Allergic rhinitis; Arthritis; Asthma; Climacteric syndrome; Complication of anesthesia; Diverticulosis; Dysrhythmia; GERD (gastroesophageal reflux disease); History of hiatal hernia; IBS (irritable bowel syndrome); and PONV (postoperative nausea and vomiting). She  does not have any pertinent problems on file. She  has a past surgical history that includes Cesarean section (1994); Abdominal hysterectomy (2012); Endometrial ablation (2009); Appendectomy; Cholecystectomy; Knee arthroscopy; Cervical fusion; Metatarsal osteotomy (Left, 09/07/2015); Foot surgery (Left, 09/01/2015); and Breast biopsy (Left, 2013). Her family history includes Breast cancer in her maternal aunt; Colon cancer in her brother; Diabetes in her mother; Heart disease in her father and mother; Hypertension in her father and mother; Stroke in her father; Thyroid disease in her mother. She  reports that she has never smoked. She has never used smokeless tobacco. She reports that she drinks about 4.8 oz of alcohol per week . She reports that she does not use drugs. Current Outpatient Prescriptions on File Prior to Visit  Medication Sig Dispense Refill  . aspirin 81 MG tablet Take 81 mg by mouth daily. Reported on 08/31/2015    . Cholecalciferol (D 1000) 1000 units capsule Take 1,000 Units by mouth daily.     Marland Kitchen desloratadine (CLARINEX) 5 MG tablet Take 5 mg by mouth as needed.  Reported on 07/16/2015    . estropipate (OGEN) 0.75 MG tablet Take 1 tablet (0.75 mg total) by mouth daily. 30 tablet 11  . ibuprofen (ADVIL,MOTRIN) 200 MG tablet Take by mouth.    . Multiple Vitamin (MULTIVITAMIN) tablet Take 1 tablet by mouth daily.    . pantoprazole (PROTONIX) 40 MG tablet Take 1 tablet (40 mg total) by mouth daily. 90 tablet 0   No current facility-administered medications on file prior to visit.          Review of Systems:  Review of Systems  Constitutional: Denied constitutional symptoms, night sweats, recent illness, fatigue, fever, insomnia and weight loss.  Eyes: Denied eye symptoms, eye pain, photophobia, vision change and visual disturbance.  Ears/Nose/Throat/Neck: Denied ear, nose, throat or neck symptoms, hearing loss, nasal discharge, sinus congestion and sore throat.  Cardiovascular: Denied cardiovascular symptoms, arrhythmia, chest pain/pressure, edema, exercise intolerance, orthopnea and palpitations.  Respiratory: Denied pulmonary symptoms, asthma, pleuritic pain, productive sputum, cough, dyspnea and wheezing.  Gastrointestinal: Denied, gastro-esophageal reflux, melena, nausea and vomiting.  Genitourinary: See HPI for additional information.  Musculoskeletal: Denied musculoskeletal symptoms, stiffness, swelling, muscle weakness and myalgia.  Dermatologic: Denied dermatology symptoms, rash and scar.  Neurologic: Denied neurology symptoms, dizziness, headache, neck pain and syncope.  Psychiatric: Denied psychiatric symptoms, anxiety and depression.  Endocrine: Denied endocrine symptoms including hot flashes and night sweats.   Meds:   Current Outpatient Prescriptions on File Prior to Visit  Medication Sig Dispense Refill  . aspirin 81 MG tablet Take 81 mg by mouth daily. Reported on 08/31/2015    .  Cholecalciferol (D 1000) 1000 units capsule Take 1,000 Units by mouth daily.     Marland Kitchen. desloratadine (CLARINEX) 5 MG tablet Take 5 mg by mouth as needed. Reported  on 07/16/2015    . estropipate (OGEN) 0.75 MG tablet Take 1 tablet (0.75 mg total) by mouth daily. 30 tablet 11  . ibuprofen (ADVIL,MOTRIN) 200 MG tablet Take by mouth.    . Multiple Vitamin (MULTIVITAMIN) tablet Take 1 tablet by mouth daily.    . pantoprazole (PROTONIX) 40 MG tablet Take 1 tablet (40 mg total) by mouth daily. 90 tablet 0   No current facility-administered medications on file prior to visit.     Objective:     Vitals:   05/05/16 0904  BP: 121/77  Pulse: 79              Examination of both breasts reveals no dominant masses. There is no breast discharge. There is no axillary adenopathy. Special attention to the area of concern in the right breast reveals no masses palpated. There is pain directly over the rib which remains over the rib despite manipulation of the breast.  Assessment:    G1P1001 Patient Active Problem List   Diagnosis Date Noted  . Preoperative clearance 08/31/2015  . Frequent PVCs 08/27/2015  . Tachycardia 07/16/2015  . Heart palpitations 07/16/2015  . Preventative health care 07/16/2015  . Pain in left upper arm 07/11/2015  . Foot pain, left 07/03/2015  . Left shoulder pain 07/03/2015  . Menopausal syndrome (hot flashes) 10/17/2014  . History of postmenopausal HRT 10/17/2014  . Irritable bowel syndrome (IBS) 08/15/2014  . GERD (gastroesophageal reflux disease) 08/14/2014  . Climacteric syndrome 08/14/2014     1. Breast pain     The area of concern does not localize to breast tissue. Seems more likely to be associated with rib or musculoskeletal pain.   Plan:            1.  Expectant management with use of NSAID S for the next 3 weeks. If pain is not resolved patient to contact us for imaging studies.        F/U  Return for Annual Physical. I spent 16 minutes with this patient of which greater than 50% was spent discussing breast cancer, breast cysts, use of hormone therapy, imaging studies.  Elonda Huskyavid J. Orissa Arreaga, M.D. 05/05/2016 9:39  AM

## 2016-07-14 ENCOUNTER — Other Ambulatory Visit: Payer: Self-pay | Admitting: Obstetrics and Gynecology

## 2016-08-19 ENCOUNTER — Ambulatory Visit (INDEPENDENT_AMBULATORY_CARE_PROVIDER_SITE_OTHER): Payer: Commercial Managed Care - PPO | Admitting: Cardiovascular Disease

## 2016-08-19 ENCOUNTER — Encounter: Payer: Self-pay | Admitting: Cardiovascular Disease

## 2016-08-19 VITALS — BP 120/88 | HR 75 | Ht 65.0 in | Wt 222.2 lb

## 2016-08-19 DIAGNOSIS — R000 Tachycardia, unspecified: Secondary | ICD-10-CM

## 2016-08-19 DIAGNOSIS — I493 Ventricular premature depolarization: Secondary | ICD-10-CM | POA: Diagnosis not present

## 2016-08-19 NOTE — Patient Instructions (Addendum)
Medication Instructions:   No medication changes made  Labwork:  No new labs needed  Testing/Procedures:  No further testing at this time   Follow-Up: It was a pleasure seeing you in the office today. Please call us if you have new issues that need to be addressed before your next appt.  [phone]  Your physician wants you to follow-up in: 12 months PRN.  You will receive a reminder letter in the mail two months in advance. If you don't receive a letter, please call our office to schedule the follow-up appointment.  If you need a refill on your cardiac medications before your next appointment, please call your pharmacy.

## 2016-08-19 NOTE — Progress Notes (Signed)
Patient ID: Teresa Guerrero, female   DOB: [birth year], 51 y.o.   MRN: [id number] Cardiology Office Note  Date:  08/19/2016   ID:  Teresa Guerrero, DOB [birth year], MRN [id number]  PCP:  McLean-Scocuzza, Teresa Guerrero   Chief Complaint  Patient presents with  . other    12 month follow up. Meds reviewed by the pt. verbally. "doing well."     HPI:  [name] is a pleasant 51 year old woman with history of  tachycardia and palpitations dating back to 2012, Event monitor showing SVT, PVCs obesity,  family history of coronary artery disease  who presents For routine follow-up for SVT  she reports having rare episodes of tachycardia perhaps once per month Minimally symptomatic, does not feel she needs medication No chest pain or shortness of breath on exertion No regular exercise program  Previous 30 day monitor 2 episodes of SVT rate 180 bpm on 08/13/2015 Symptoms went for approximately 20 minutes, broke on their own  She does report one other episode that she did not have the monitor in place, feels she had 3 episodes over the month  EKG on today's visit shows normal sinus rhythm with no significant ST or T-wave changes  Other past medical history reviewed She reports having tachycardia May 2012, was sent to the hospital for evaluation. Records reviewed from her hospitalization showing normal echocardiogram, normal stress test She was told she had benign tachycardia and was not treated with medication  She did have elevated triglycerides, started on TriCor With diet and exercise triglycerides improved and she has stopped TriCor In the summer of 2016 she stopped her estrogen, felt that her fluttering and skips he came worse They also seem to get worse when she is anxious Perhaps more episodes of tachycardia and palpitations in the past several months  Went to the emergency room one month ago April 2017 for tachycardia EKG at that time showed normal sinus rhythm, no significant arrhythmia and  she was discharged home after lab work was benign Lab work reviewed with her showing normal thyroid, CBC, chemistries Potassium 3.4 discussed with her  She denies any significant chest pain on exertion but she is concerned as father had coronary disease, MI Mother had electrical disease, ablations, coronary calcifications  Patient's total cholesterol 226, LDL 119 in 2012, no lipid panel since that time   PMH:   has a past medical history of Allergic rhinitis; Arthritis; Asthma; Climacteric syndrome; Complication of anesthesia; Diverticulosis; Dysrhythmia; GERD (gastroesophageal reflux disease); History of hiatal hernia; IBS (irritable bowel syndrome); and PONV (postoperative nausea and vomiting).  PSH:    Past Surgical History:  Procedure Laterality Date  . ABDOMINAL HYSTERECTOMY  2012   Menorrhagia, No cervix, No ovaries  . APPENDECTOMY    . BREAST BIOPSY Left 2013   normal results pt had gene test BRACA NEG  . CERVICAL FUSION    . CESAREAN SECTION  1994  . CHOLECYSTECTOMY    . ENDOMETRIAL ABLATION  2009  . FOOT SURGERY Left 09/01/2015  . KNEE ARTHROSCOPY     right knee   . METATARSAL OSTEOTOMY Left 09/07/2015   Procedure: METATARSAL OSTEOTOMY/2nd met left;  Surgeon: Sharlotte Alamo, DPM;  Location: [facility] ORS;  Service: Podiatry;  Laterality: Left;    Current Outpatient Prescriptions  Medication Sig Dispense Refill  . aspirin 81 MG tablet Take 81 mg by mouth daily. Reported on 08/31/2015    . Cholecalciferol (D 1000) 1000 units capsule Take 1,000 Units by mouth daily.     Marland Kitchen  desloratadine (CLARINEX) 5 MG tablet Take 5 mg by mouth as needed. Reported on 07/16/2015    . EPIPEN 2-PAK 0.3 MG/0.3ML SOAJ injection INJECT 0.3 MG SUBCUTANEOUSLY/INTRAMUSCULARLY X 1. MAY REPEAT DOSE X 1 AFTER 5-15 MIN.  0  . estropipate (OGEN) 0.75 MG tablet TAKE 1 TABLET (0.75 MG TOTAL) BY MOUTH DAILY. 90 tablet 3  . fenofibrate (TRICOR) 48 MG tablet Take 48 mg by mouth daily.    Marland Kitchen ibuprofen (ADVIL,MOTRIN) 200 MG  tablet Take by mouth.    . Multiple Vitamin (MULTIVITAMIN) tablet Take 1 tablet by mouth daily.    . pantoprazole (PROTONIX) 40 MG tablet Take 1 tablet (40 mg total) by mouth daily. 90 tablet 0   No current facility-administered medications for this visit.      Allergies:   Formaldehyde; Kefzol [cefazolin]; Penicillin g; Penicillins; Prednisone; Azithromycin; and Ciprofloxacin   Social History:  The patient  reports that she has never smoked. She has never used smokeless tobacco. She reports that she drinks about 4.8 oz of alcohol per week . She reports that she does not use drugs.   Family History:   family history includes Breast cancer in her maternal aunt; Colon cancer in her brother; Diabetes in her mother; Heart disease in her father and mother; Hypertension in her father and mother; Stroke in her father; Thyroid disease in her mother.    Review of Systems: Review of Systems  Constitutional: Negative.   Respiratory: Negative.   Cardiovascular: Positive for palpitations.       Tachycardia  Gastrointestinal: Negative.   Musculoskeletal: Negative.   Neurological: Negative.   Psychiatric/Behavioral: Negative.   All other systems reviewed and are negative.    PHYSICAL EXAM: VS:  BP 120/88 (BP Location: Left Arm, Patient Position: Sitting, Cuff Size: Normal)   Pulse 75   Ht '5\' 5"'$  (1.651 m)   Wt 222 lb 4 oz (100.8 kg)   LMP 03/03/2010 (Exact Date)   BMI 36.98 kg/m  , BMI Body mass index is 36.98 kg/m. GEN: Well nourished, well developed, in no acute distress HEENT: normal Neck: no JVD, carotid bruits, or masses Cardiac: RRR; no murmurs, rubs, or gallops,no edema  Respiratory:  clear to auscultation bilaterally, normal work of breathing GI: soft, nontender, nondistended, + BS MS: no deformity or atrophy Skin: warm and dry, no rash Neuro:  Strength and sensation are intact Psych: euthymic mood, full affect    Recent Labs: 08/30/2015: Hemoglobin 14.3; Platelets 181;  Potassium 3.8    Lipid Panel No results found for: CHOL, HDL, LDLCALC, TRIG    Wt Readings from Last 3 Encounters:  08/19/16 222 lb 4 oz (100.8 kg)  05/05/16 219 lb 7 oz (99.5 kg)  11/08/15 219 lb 12.8 oz (99.7 kg)       ASSESSMENT AND PLAN:  Paroxysmal SVT (supraventricular tachycardia) (HCC) Several runs of SVT on recent 30 day monitor Episodes once a month If symptoms get worse, could start on rhythm control medication She has declined any changes to her medications at this time  Frequent PVCs Documented on 30 day monitor, symptomatic Appreciated on exam today Asymptomatic  Disposition:   F/U  12 months as needed   Total encounter time more than 15 minutes  Greater than 50% was spent in counseling and coordination of care with the patient     Orders Placed This Encounter  Procedures  . EKG 12-Lead     Signed, Esmond Plants, M.D., Ph.D. 08/19/2016  [place]  [phone]    

## 2016-11-12 ENCOUNTER — Encounter: Payer: Managed Care, Other (non HMO) | Admitting: Obstetrics and Gynecology

## 2016-11-18 ENCOUNTER — Encounter: Payer: Commercial Managed Care - PPO | Admitting: Obstetrics and Gynecology

## 2017-01-30 ENCOUNTER — Other Ambulatory Visit: Payer: Self-pay | Admitting: Neurology

## 2017-01-30 DIAGNOSIS — R519 Headache, unspecified: Secondary | ICD-10-CM

## 2017-01-30 DIAGNOSIS — R51 Headache: Principal | ICD-10-CM

## 2017-02-05 ENCOUNTER — Ambulatory Visit
Admission: RE | Admit: 2017-02-05 | Discharge: 2017-02-05 | Disposition: A | Discharge status: Home / Self Care | Payer: Commercial Managed Care - PPO | Source: Ambulatory Visit | Attending: Neurology | Admitting: Neurology

## 2017-02-05 DIAGNOSIS — R51 Headache: Secondary | ICD-10-CM | POA: Diagnosis not present

## 2017-02-05 DIAGNOSIS — R519 Headache, unspecified: Secondary | ICD-10-CM

## 2017-02-13 ENCOUNTER — Encounter: Payer: Commercial Managed Care - PPO | Admitting: Obstetrics and Gynecology

## 2017-03-18 ENCOUNTER — Ambulatory Visit (INDEPENDENT_AMBULATORY_CARE_PROVIDER_SITE_OTHER): Payer: Commercial Managed Care - PPO | Admitting: Obstetrics and Gynecology

## 2017-03-18 ENCOUNTER — Encounter: Payer: Self-pay | Admitting: Obstetrics and Gynecology

## 2017-03-18 VITALS — BP 113/75 | HR 80 | Ht 65.0 in | Wt 220.4 lb

## 2017-03-18 DIAGNOSIS — Z1231 Encounter for screening mammogram for malignant neoplasm of breast: Secondary | ICD-10-CM

## 2017-03-18 DIAGNOSIS — Z01419 Encounter for gynecological examination (general) (routine) without abnormal findings: Secondary | ICD-10-CM

## 2017-03-18 DIAGNOSIS — E669 Obesity, unspecified: Secondary | ICD-10-CM | POA: Diagnosis not present

## 2017-03-18 DIAGNOSIS — Z79890 Hormone replacement therapy (postmenopausal): Secondary | ICD-10-CM

## 2017-03-18 MED ORDER — ESTROPIPATE 0.75 MG PO TABS: 0.7500 mg | ORAL_TABLET | Freq: Every day | ORAL | 3 refills | Status: DC

## 2017-03-18 NOTE — Patient Instructions (Signed)
Health Maintenance for Postmenopausal Women Menopause is a normal process in which your reproductive ability comes to an end. This process happens gradually over a span of months to years, usually between the ages of 22 and 9. Menopause is complete when you have missed 12 consecutive menstrual periods. It is important to talk with your health care provider about some of the most common conditions that affect postmenopausal women, such as heart disease, cancer, and bone loss (osteoporosis). Adopting a healthy lifestyle and getting preventive care can help to promote your health and wellness. Those actions can also lower your chances of developing some of these common conditions. What should I know about menopause? During menopause, you may experience a number of symptoms, such as:  Moderate-to-severe hot flashes.  Night sweats.  Decrease in sex drive.  Mood swings.  Headaches.  Tiredness.  Irritability.  Memory problems.  Insomnia.  Choosing to treat or not to treat menopausal changes is an individual decision that you make with your health care provider. What should I know about hormone replacement therapy and supplements? Hormone therapy products are effective for treating symptoms that are associated with menopause, such as hot flashes and night sweats. Hormone replacement carries certain risks, especially as you become older. If you are thinking about using estrogen or estrogen with progestin treatments, discuss the benefits and risks with your health care provider. What should I know about heart disease and stroke? Heart disease, heart attack, and stroke become more likely as you age. This may be due, in part, to the hormonal changes that your body experiences during menopause. These can affect how your body processes dietary fats, triglycerides, and cholesterol. Heart attack and stroke are both medical emergencies. There are many things that you can do to help prevent heart disease  and stroke:  Have your blood pressure checked at least every 1-2 years. High blood pressure causes heart disease and increases the risk of stroke.  If you are 53-22 years old, ask your health care provider if you should take aspirin to prevent a heart attack or a stroke.  Do not use any tobacco products, including cigarettes, chewing tobacco, or electronic cigarettes. If you need help quitting, ask your health care provider.  It is important to eat a healthy diet and maintain a healthy weight. ? Be sure to include plenty of vegetables, fruits, low-fat dairy products, and lean protein. ? Avoid eating foods that are high in solid fats, added sugars, or salt (sodium).  Get regular exercise. This is one of the most important things that you can do for your health. ? Try to exercise for at least 150 minutes each week. The type of exercise that you do should increase your heart rate and make you sweat. This is known as moderate-intensity exercise. ? Try to do strengthening exercises at least twice each week. Do these in addition to the moderate-intensity exercise.  Know your numbers.Ask your health care provider to check your cholesterol and your blood glucose. Continue to have your blood tested as directed by your health care provider.  What should I know about cancer screening? There are several types of cancer. Take the following steps to reduce your risk and to catch any cancer development as early as possible. Breast Cancer  Practice breast self-awareness. ? This means understanding how your breasts normally appear and feel. ? It also means doing regular breast self-exams. Let your health care provider know about any changes, no matter how small.  If you are 40  or older, have a clinician do a breast exam (clinical breast exam or CBE) every year. Depending on your age, family history, and medical history, it may be recommended that you also have a yearly breast X-ray (mammogram).  If you  have a family history of breast cancer, talk with your health care provider about genetic screening.  If you are at high risk for breast cancer, talk with your health care provider about having an MRI and a mammogram every year.  Breast cancer (BRCA) gene test is recommended for women who have family members with BRCA-related cancers. Results of the assessment will determine the need for genetic counseling and BRCA1 and for BRCA2 testing. BRCA-related cancers include these types: ? Breast. This occurs in males or females. ? Ovarian. ? Tubal. This may also be called fallopian tube cancer. ? Cancer of the abdominal or pelvic lining (peritoneal cancer). ? Prostate. ? Pancreatic.  Cervical, Uterine, and Ovarian Cancer Your health care provider may recommend that you be screened regularly for cancer of the pelvic organs. These include your ovaries, uterus, and vagina. This screening involves a pelvic exam, which includes checking for microscopic changes to the surface of your cervix (Pap test).  For women ages 21-65, health care providers may recommend a pelvic exam and a Pap test every three years. For women ages 79-65, they may recommend the Pap test and pelvic exam, combined with testing for human papilloma virus (HPV), every five years. Some types of HPV increase your risk of cervical cancer. Testing for HPV may also be done on women of any age who have unclear Pap test results.  Other health care providers may not recommend any screening for nonpregnant women who are considered low risk for pelvic cancer and have no symptoms. Ask your health care provider if a screening pelvic exam is right for you.  If you have had past treatment for cervical cancer or a condition that could lead to cancer, you need Pap tests and screening for cancer for at least 20 years after your treatment. If Pap tests have been discontinued for you, your risk factors (such as having a new sexual partner) need to be  reassessed to determine if you should start having screenings again. Some women have medical problems that increase the chance of getting cervical cancer. In these cases, your health care provider may recommend that you have screening and Pap tests more often.  If you have a family history of uterine cancer or ovarian cancer, talk with your health care provider about genetic screening.  If you have vaginal bleeding after reaching menopause, tell your health care provider.  There are currently no reliable tests available to screen for ovarian cancer.  Lung Cancer Lung cancer screening is recommended for adults 69-62 years old who are at high risk for lung cancer because of a history of smoking. A yearly low-dose CT scan of the lungs is recommended if you:  Currently smoke.  Have a history of at least 30 pack-years of smoking and you currently smoke or have quit within the past 15 years. A pack-year is smoking an average of one pack of cigarettes per day for one year.  Yearly screening should:  Continue until it has been 15 years since you quit.  Stop if you develop a health problem that would prevent you from having lung cancer treatment.  Colorectal Cancer  This type of cancer can be detected and can often be prevented.  Routine colorectal cancer screening usually begins at  age 42 and continues through age 45.  If you have risk factors for colon cancer, your health care provider may recommend that you be screened at an earlier age.  If you have a family history of colorectal cancer, talk with your health care provider about genetic screening.  Your health care provider may also recommend using home test kits to check for hidden blood in your stool.  A small camera at the end of a tube can be used to examine your colon directly (sigmoidoscopy or colonoscopy). This is done to check for the earliest forms of colorectal cancer.  Direct examination of the colon should be repeated every  5-10 years until age 71. However, if early forms of precancerous polyps or small growths are found or if you have a family history or genetic risk for colorectal cancer, you may need to be screened more often.  Skin Cancer  Check your skin from head to toe regularly.  Monitor any moles. Be sure to tell your health care provider: ? About any new moles or changes in moles, especially if there is a change in a mole's shape or color. ? If you have a mole that is larger than the size of a pencil eraser.  If any of your family members has a history of skin cancer, especially at a young age, talk with your health care provider about genetic screening.  Always use sunscreen. Apply sunscreen liberally and repeatedly throughout the day.  Whenever you are outside, protect yourself by wearing long sleeves, pants, a wide-brimmed hat, and sunglasses.  What should I know about osteoporosis? Osteoporosis is a condition in which bone destruction happens more quickly than new bone creation. After menopause, you may be at an increased risk for osteoporosis. To help prevent osteoporosis or the bone fractures that can happen because of osteoporosis, the following is recommended:  If you are 46-71 years old, get at least 1,000 mg of calcium and at least 600 mg of vitamin D per day.  If you are older than age 55 but younger than age 65, get at least 1,200 mg of calcium and at least 600 mg of vitamin D per day.  If you are older than age 54, get at least 1,200 mg of calcium and at least 800 mg of vitamin D per day.  Smoking and excessive alcohol intake increase the risk of osteoporosis. Eat foods that are rich in calcium and vitamin D, and do weight-bearing exercises several times each week as directed by your health care provider. What should I know about how menopause affects my mental health? Depression may occur at any age, but it is more common as you become older. Common symptoms of depression  include:  Low or sad mood.  Changes in sleep patterns.  Changes in appetite or eating patterns.  Feeling an overall lack of motivation or enjoyment of activities that you previously enjoyed.  Frequent crying spells.  Talk with your health care provider if you think that you are experiencing depression. What should I know about immunizations? It is important that you get and maintain your immunizations. These include:  Tetanus, diphtheria, and pertussis (Tdap) booster vaccine.  Influenza every year before the flu season begins.  Pneumonia vaccine.  Shingles vaccine.  Your health care provider may also recommend other immunizations. This information is not intended to replace advice given to you by your health care provider. Make sure you discuss any questions you have with your health care provider. Document Released: 04/11/2005  Document Revised: 09/07/2015 Document Reviewed: 11/21/2014 [employer] Interactive Patient Education  2018 [employer]  

## 2017-03-18 NOTE — Progress Notes (Signed)
ANNUAL PREVENTATIVE CARE GYNECOLOGY  ENCOUNTER NOTE  Subjective:       Teresa Guerrero is a 52 y.o. G46P1001 female here for a routine annual gynecologic exam. The patient is sexually active (active). The patient is taking hormone replacement therapy. Patient denies post-menopausal vaginal bleeding. The patient wears seatbelts: yes. The patient participates in regular exercise: no. Has the patient ever been transfused or tattooed?: no. The patient reports that there is not domestic violence in her life. Current complaints: 1.  None    Gynecologic History Patient's last menstrual period was 03/03/2010 (exact date).  Is s/p hysterectomy Contraception: post menopausal status Last Pap: 03/2010. Results were: normal Last mammogram: 10/2015. Results were: normal.  H/o 1 abnormal mammogram several years ago, had needle biopsy (left side, marker placed) Last Colonoscopy: has been within 7 years (cannot recall exact date).  Results were normal.    Obstetric History OB History  Gravida Para Term Preterm AB Living  '1 1 1     1  '$ SAB TAB Ectopic Multiple Live Births          1    # Outcome Date GA Lbr Len/2nd Weight Sex Delivery Anes PTL Lv  1 Term 06/19/92 [redacted]w[redacted]d 8 lb 12 oz (3.969 kg) F CS-Unspec   LIV      Past Medical History:  Diagnosis Date  . Allergic rhinitis   . Arthritis   . Asthma   . Climacteric syndrome   . Complication of anesthesia   . Diverticulosis   . Dysrhythmia    tachycardia/palpitations  . GERD (gastroesophageal reflux disease)   . History of hiatal hernia   . IBS (irritable bowel syndrome)   . PONV (postoperative nausea and vomiting)     Family History  Problem Relation Age of Onset  . Diabetes Mother   . Thyroid disease Mother   . Hypertension Mother   . Heart disease Mother   . Hypertension Father   . Stroke Father   . Heart disease Father   . Colon cancer Brother   . Breast cancer Maternal Aunt     Past Surgical History:  Procedure Laterality Date  .  ABDOMINAL HYSTERECTOMY  2012   Menorrhagia, No cervix, No ovaries  . APPENDECTOMY    . BREAST BIOPSY Left 2013   normal results pt had gene test BRACA NEG  . CERVICAL FUSION    . CESAREAN SECTION  1994  . CHOLECYSTECTOMY    . ENDOMETRIAL ABLATION  2009  . FOOT SURGERY Left 09/01/2015  . KNEE ARTHROSCOPY     right knee   . METATARSAL OSTEOTOMY Left 09/07/2015   Procedure: METATARSAL OSTEOTOMY/2nd met left;  Surgeon: TSharlotte Alamo DPM;  Location: [facility] ORS;  Service: Podiatry;  Laterality: Left;    Social History   Socioeconomic History  . Marital status: Married    Spouse name: Not on file  . Number of children: Not on file  . Years of education: Not on file  . Highest education level: Not on file  Social Needs  . Financial resource strain: Not on file  . Food insecurity - worry: Not on file  . Food insecurity - inability: Not on file  . Transportation needs - medical: Not on file  . Transportation needs - non-medical: Not on file  Occupational History  . Not on file  Tobacco Use  . Smoking status: Never Smoker  . Smokeless tobacco: Never Used  Substance and Sexual Activity  . Alcohol use: Yes  Alcohol/week: 4.8 oz    Types: 8 Glasses of wine per week  . Drug use: No  . Sexual activity: Yes    Birth control/protection: Surgical  Other Topics Concern  . Not on file  Social History Narrative  . Not on file    Current Outpatient Medications on File Prior to Visit  Medication Sig Dispense Refill  . aspirin 81 MG tablet Take 81 mg by mouth daily. Reported on 08/31/2015    . Cholecalciferol (D 1000) 1000 units capsule Take 1,000 Units by mouth daily.     Marland Kitchen desloratadine (CLARINEX) 5 MG tablet Take 5 mg by mouth as needed. Reported on 07/16/2015    . DOCOSAHEXAENOIC ACID PO Take by mouth.    . EPIPEN 2-PAK 0.3 MG/0.3ML SOAJ injection INJECT 0.3 MG SUBCUTANEOUSLY/INTRAMUSCULARLY X 1. MAY REPEAT DOSE X 1 AFTER 5-15 MIN.  0  . estropipate (OGEN) 0.75 MG tablet TAKE 1 TABLET  (0.75 MG TOTAL) BY MOUTH DAILY. 90 tablet 3  . fenofibrate (TRICOR) 48 MG tablet Take 48 mg by mouth daily.    Marland Kitchen gabapentin (NEURONTIN) 100 MG capsule Take 100 mg daily for a week then increase to 200 mg daily.    Marland Kitchen ibuprofen (ADVIL,MOTRIN) 200 MG tablet Take by mouth.    Marland Kitchen Lifitegrast (XIIDRA) 5 % SOLN Apply to eye.    . meloxicam (MOBIC) 7.5 MG tablet Take by mouth.    . Multiple Vitamin (MULTIVITAMIN) tablet Take 1 tablet by mouth daily.    . pantoprazole (PROTONIX) 40 MG tablet Take 1 tablet (40 mg total) by mouth daily. 90 tablet 0   No current facility-administered medications on file prior to visit.     Allergies  Allergen Reactions  . Formaldehyde Hives and Rash  . Kefzol [Cefazolin] Anaphylaxis and Cough    Other reaction(s): Other (See Comments)  . Penicillin G Anaphylaxis    Has patient had a PCN reaction causing immediate rash, facial/tongue/throat swelling, SOB or lightheadedness with hypotension: Yes Has patient had a PCN reaction causing severe rash involving mucus membranes or skin necrosis: No Has patient had a PCN reaction that required hospitalization No Has patient had a PCN reaction occurring within the last 10 years: Yes If all of the above answers are "NO", then may proceed with Cephalosporin use.   Marland Kitchen Penicillins Anaphylaxis  . Prednisone Anaphylaxis  . Azithromycin Rash  . Ciprofloxacin Rash     Review of Systems ROS Review of Systems - General ROS: negative for - chills, fatigue, fever, hot flashes, night sweats, weight gain or weight loss Psychological ROS: negative for - anxiety, decreased libido, depression, mood swings, physical abuse or sexual abuse Ophthalmic ROS: negative for - blurry vision, eye pain or loss of vision ENT ROS: negative for - headaches, hearing change, visual changes or vocal changes Allergy and Immunology ROS: negative for - hives, itchy/watery eyes or seasonal allergies Hematological and Lymphatic ROS: negative for - bleeding  problems, bruising, swollen lymph nodes or weight loss Endocrine ROS: negative for - galactorrhea, hair pattern changes, hot flashes, malaise/lethargy, mood swings, palpitations, polydipsia/polyuria, skin changes, temperature intolerance or unexpected weight changes Breast ROS: negative for - new or changing breast lumps or nipple discharge Respiratory ROS: negative for - cough or shortness of breath Cardiovascular ROS: negative for - chest pain, irregular heartbeat, palpitations or shortness of breath Gastrointestinal ROS: no abdominal pain, change in bowel habits, or black or bloody stools Genito-Urinary ROS: no dysuria, trouble voiding, or hematuria Musculoskeletal ROS: negative for -  joint pain or joint stiffness Neurological ROS: negative for - bowel and bladder control changes Dermatological ROS: negative for rash and skin lesion changes   Objective:   BP 113/75 (BP Location: Left Arm, Patient Position: Sitting, Cuff Size: Normal)   Pulse 80   Ht '5\' 5"'$  (1.651 m)   Wt 220 lb 6.4 oz (100 kg)   LMP 03/03/2010 (Exact Date)   BMI 36.68 kg/m  CONSTITUTIONAL: Well-developed, well-nourished female in no acute distress. Moderate obesity PSYCHIATRIC: Normal mood and affect. Normal behavior. Normal judgment and thought content. [place]: Alert and oriented to person, place, and time. Normal muscle tone coordination. No cranial nerve deficit noted. HENT:  Normocephalic, atraumatic, External right and left ear normal. Oropharynx is clear and moist EYES: Conjunctivae and EOM are normal. Pupils are equal, round, and reactive to light. No scleral icterus.  NECK: Normal range of motion, supple, no masses.  Normal thyroid.  SKIN: Skin is warm and dry. No rash noted. Not diaphoretic. No erythema. No pallor. CARDIOVASCULAR: Normal heart rate noted, regular rhythm, no murmur. RESPIRATORY: Clear to auscultation bilaterally. Effort and breath sounds normal, no problems with respiration noted. BREASTS:  Symmetric in size. No masses, skin changes, nipple drainage, or lymphadenopathy. ABDOMEN: Soft, normal bowel sounds, no distention noted.  No tenderness, rebound or guarding.  BLADDER: Normal PELVIC:  Bladder no bladder distension noted  Urethra: normal appearing urethra with no masses, tenderness or lesions  Vulva: normal appearing vulva with no masses, tenderness or lesions  Vagina: normal appearing vagina with normal color and discharge, no lesions  Cervix: surgically absent  Uterus: surgically absent, vaginal cuff well healed  Adnexa: surgically absent bilateral  RV: External Exam NormaI, No Rectal Masses and Normal Sphincter tone  MUSCULOSKELETAL: Normal range of motion. No tenderness.  No cyanosis, clubbing, or edema.  2+ distal pulses. LYMPHATIC: No Axillary, Supraclavicular, or Inguinal Adenopathy.     Labs:  Lab Results  Component Value Date   WBC 8.3 08/30/2015   HGB 14.3 08/30/2015   HCT 42.2 08/30/2015   MCV 92.5 08/30/2015   PLT 181 08/30/2015       Chemistry      Component Value Date/Time   NA 135 06/08/2015 2123   K 3.8 08/30/2015 1300   CL 102 06/08/2015 2123   CO2 25 06/08/2015 2123   BUN 19 06/08/2015 2123   CREATININE 0.90 06/08/2015 2123      Component Value Date/Time   CALCIUM 9.7 06/08/2015 2123      Lab Results  Component Value Date   TSH 3.699 06/07/2015    Results for TALYIA, ALLENDE (MRN [id number]) as of 11/10/2015 23:43  Ref. Range 06/08/2015 21:23  Glucose Latest Ref Range: 65 - 99 mg/dL 107 (H)    No results found for: CHOL, HDL, LDLCALC, LDLDIRECT, TRIG, CHOLHDL   Assessment:   Annual gynecologic examination 52 y.o. Contraception: status post hysterectomy Obesity (Class II) Postmenopausal, on HRT  Plan:  Pap: Not needed. Is s/p hysterectomy with no prior h/o abnormal pap smears.  Mammogram: Ordered Stool Guaiac Testing:  Not Ordered.  Up to date with colonoscopy. Labs: None ordered.  Patient has labs done by PCP at [facility]. Last labs 08/2016. Due for recheck next week.  Routine preventative health maintenance measures emphasized: Exercise/Diet/Weight control, Alcohol/Substance use risks and Stress Management Continue HRT, lowest dose for shortest time frame. Reiterated risks vs.benefits of HRT.  Advised on continued attempts to wean at least once yearly by either taking 1 2/ pill  daily, or taking full pill every other day.  Return to [place], MD [facility]

## 2017-04-02 ENCOUNTER — Ambulatory Visit
Admission: RE | Admit: 2017-04-02 | Discharge: 2017-04-02 | Disposition: A | Discharge status: Home / Self Care | Payer: Commercial Managed Care - PPO | Source: Ambulatory Visit | Attending: Obstetrics and Gynecology | Admitting: Obstetrics and Gynecology

## 2017-04-02 DIAGNOSIS — Z1231 Encounter for screening mammogram for malignant neoplasm of breast: Secondary | ICD-10-CM

## 2017-04-22 ENCOUNTER — Encounter: Payer: Self-pay | Admitting: Obstetrics and Gynecology

## 2017-09-18 ENCOUNTER — Other Ambulatory Visit: Payer: Self-pay | Admitting: Surgery

## 2017-09-18 DIAGNOSIS — M25561 Pain in right knee: Secondary | ICD-10-CM

## 2017-10-07 ENCOUNTER — Ambulatory Visit
Admission: RE | Admit: 2017-10-07 | Discharge: 2017-10-07 | Disposition: A | Discharge status: Home / Self Care | Payer: Commercial Managed Care - PPO | Source: Ambulatory Visit | Attending: Surgery | Admitting: Surgery

## 2017-10-07 DIAGNOSIS — M2341 Loose body in knee, right knee: Secondary | ICD-10-CM | POA: Diagnosis not present

## 2017-10-07 DIAGNOSIS — M7121 Synovial cyst of popliteal space [Baker], right knee: Secondary | ICD-10-CM | POA: Diagnosis not present

## 2017-10-07 DIAGNOSIS — M1711 Unilateral primary osteoarthritis, right knee: Secondary | ICD-10-CM | POA: Diagnosis not present

## 2017-10-07 DIAGNOSIS — M25561 Pain in right knee: Secondary | ICD-10-CM | POA: Diagnosis present

## 2017-11-04 ENCOUNTER — Encounter
Admission: RE | Admit: 2017-11-04 | Discharge: 2017-11-04 | Disposition: A | Discharge status: Home / Self Care | Payer: Commercial Managed Care - PPO | Source: Ambulatory Visit | Attending: Surgery | Admitting: Surgery

## 2017-11-04 ENCOUNTER — Other Ambulatory Visit: Payer: Self-pay | Admitting: Surgery

## 2017-11-04 ENCOUNTER — Ambulatory Visit
Admission: RE | Admit: 2017-11-04 | Discharge: 2017-11-04 | Disposition: A | Discharge status: Home / Self Care | Payer: Commercial Managed Care - PPO | Source: Ambulatory Visit | Attending: Surgery | Admitting: Surgery

## 2017-11-04 ENCOUNTER — Ambulatory Visit: Admission: RE | Admit: 2017-11-04 | Payer: Commercial Managed Care - PPO | Source: Ambulatory Visit

## 2017-11-04 ENCOUNTER — Other Ambulatory Visit: Payer: Self-pay

## 2017-11-04 DIAGNOSIS — Z01818 Encounter for other preprocedural examination: Secondary | ICD-10-CM

## 2017-11-04 DIAGNOSIS — Z01810 Encounter for preprocedural cardiovascular examination: Secondary | ICD-10-CM

## 2017-11-04 DIAGNOSIS — Z8709 Personal history of other diseases of the respiratory system: Secondary | ICD-10-CM

## 2017-11-04 DIAGNOSIS — Z01812 Encounter for preprocedural laboratory examination: Secondary | ICD-10-CM | POA: Insufficient documentation

## 2017-11-04 HISTORY — DX: Pure hypercholesterolemia, unspecified: E78.00

## 2017-11-04 HISTORY — DX: Meniere's disease, unspecified ear: H81.09

## 2017-11-04 LAB — CBC
HCT: 42.3 % (ref 35.0–47.0)
Hemoglobin: 14.4 g/dL (ref 12.0–16.0)
MCH: 31 pg (ref 26.0–34.0)
MCHC: 34.1 g/dL (ref 32.0–36.0)
MCV: 91 fL (ref 80.0–100.0)
Platelets: 230 10*3/uL (ref 150–440)
RBC: 4.64 MIL/uL (ref 3.80–5.20)
RDW: 13.6 % (ref 11.5–14.5)
WBC: 5.9 10*3/uL (ref 3.6–11.0)

## 2017-11-04 LAB — BASIC METABOLIC PANEL
Anion gap: 8 (ref 5–15)
BUN: 18 mg/dL (ref 6–20)
CALCIUM: 9.5 mg/dL (ref 8.9–10.3)
CO2: 28 mmol/L (ref 22–32)
Chloride: 105 mmol/L (ref 98–111)
Creatinine, Ser: 0.85 mg/dL (ref 0.44–1.00)
Glucose, Bld: 79 mg/dL (ref 70–99)
Potassium: 3.5 mmol/L (ref 3.5–5.1)
Sodium: 141 mmol/L (ref 135–145)

## 2017-11-04 LAB — URINALYSIS, ROUTINE W REFLEX MICROSCOPIC
BILIRUBIN URINE: NEGATIVE
Glucose, UA: NEGATIVE mg/dL
HGB URINE DIPSTICK: NEGATIVE
KETONES UR: NEGATIVE mg/dL
Leukocytes, UA: NEGATIVE
Nitrite: NEGATIVE
PROTEIN: NEGATIVE mg/dL
Specific Gravity, Urine: 1.014 (ref 1.005–1.030)
pH: 7 (ref 5.0–8.0)

## 2017-11-04 LAB — PROTIME-INR
INR: 0.91
Prothrombin Time: 12.2 seconds (ref 11.4–15.2)

## 2017-11-04 LAB — SURGICAL PCR SCREEN
MRSA, PCR: NEGATIVE
Staphylococcus aureus: NEGATIVE

## 2017-11-04 NOTE — Patient Instructions (Signed)
Your procedure is scheduled on:  Thursday, November 12, 2017 Report to Day Surgery on the 2nd floor of the [facility] To find out your arrival time, please call [phone] between 1PM - 3PM on: Wednesday, November 11, 2017  REMEMBER: Instructions that are not followed completely may result in serious medical risk, up to and including death; or upon the discretion of your surgeon and anesthesiologist your surgery may need to be rescheduled.  Do not eat food after midnight the night before surgery.  No gum chewing, lozengers or hard candies.  You may however, drink CLEAR liquids up to 2 hours before you are scheduled to arrive for your surgery. Do not drink anything within 2 hours of the start of your surgery.  Clear liquids include: - water  - apple juice without pulp - gatorade - black coffee or tea (Do NOT add milk or creamers to the coffee or tea) Do NOT drink anything that is not on this list.  No Alcohol for 24 hours before or after surgery.  No Smoking including e-cigarettes for 24 hours prior to surgery.  No chewable tobacco products for at least 6 hours prior to surgery.  No nicotine patches on the day of surgery.  On the morning of surgery brush your teeth with toothpaste and water, you may rinse your mouth with mouthwash if you wish. Do not swallow any toothpaste or mouthwash.  Notify your doctor if there is any change in your medical condition (cold, fever, infection).  Do not wear jewelry, make-up, hairpins, clips or nail polish.  Do not wear lotions, powders, or perfumes. You may wear deodorant.  Do not shave 48 hours prior to surgery.   Contacts and dentures may not be worn into surgery.  Do not bring valuables to the hospital, including drivers license, insurance or credit cards.  [facility] is not responsible for any belongings or valuables.   TAKE THESE MEDICATIONS THE MORNING OF SURGERY:  1.  Pantoprazole (take one the night before surgery and one  the morning of surgery - helps prevent nausea after surgery)  Use CHG Soap as directed on instruction sheet.  NOW!  Stop ASPIRIN and Anti-inflammatories (NSAIDS) such as Advil, Aleve, Ibuprofen, Motrin, Naproxen, Naprosyn and Aspirin based products such as Excedrin, Goodys Powder, BC Powder. (May take Tylenol or Acetaminophen if needed.)  NOW!  Stop ANY OVER THE COUNTER supplements until after surgery. (FISH OIL) (May continue multivitamin.)  Wear comfortable clothing (specific to your surgery type) to the hospital.  Plan for stool softeners for home use.  If you are being admitted to the hospital overnight, leave your suitcase in the car. After surgery it may be brought to your room.  Please call [phone] if you have any questions about these instructions.

## 2017-11-11 MED ORDER — CLINDAMYCIN PHOSPHATE 900 MG/50ML IV SOLN
900.0000 mg | Freq: Once | INTRAVENOUS | Status: AC
  Administered 2017-11-12: 900 mg via INTRAVENOUS

## 2017-11-12 ENCOUNTER — Inpatient Hospital Stay: Payer: Commercial Managed Care - PPO

## 2017-11-12 ENCOUNTER — Encounter: Payer: Self-pay | Admitting: *Deleted

## 2017-11-12 ENCOUNTER — Other Ambulatory Visit: Payer: Self-pay

## 2017-11-12 ENCOUNTER — Encounter
Admission: RE | Disposition: A | Discharge status: Home Health | Payer: Self-pay | Source: Home / Self Care | Attending: Surgery

## 2017-11-12 ENCOUNTER — Inpatient Hospital Stay: Payer: Commercial Managed Care - PPO | Admitting: Anesthesiology

## 2017-11-12 ENCOUNTER — Inpatient Hospital Stay
Admission: RE | Admit: 2017-11-12 | Discharge: 2017-11-14 | DRG: 470 | Disposition: A | Discharge status: Home Health | Payer: Commercial Managed Care - PPO | Attending: Surgery | Admitting: Surgery

## 2017-11-12 DIAGNOSIS — K449 Diaphragmatic hernia without obstruction or gangrene: Secondary | ICD-10-CM | POA: Diagnosis present

## 2017-11-12 DIAGNOSIS — M1711 Unilateral primary osteoarthritis, right knee: Principal | ICD-10-CM | POA: Diagnosis present

## 2017-11-12 DIAGNOSIS — Z880 Allergy status to penicillin: Secondary | ICD-10-CM | POA: Diagnosis not present

## 2017-11-12 DIAGNOSIS — Z7982 Long term (current) use of aspirin: Secondary | ICD-10-CM

## 2017-11-12 DIAGNOSIS — Z9049 Acquired absence of other specified parts of digestive tract: Secondary | ICD-10-CM

## 2017-11-12 DIAGNOSIS — K5790 Diverticulosis of intestine, part unspecified, without perforation or abscess without bleeding: Secondary | ICD-10-CM | POA: Diagnosis present

## 2017-11-12 DIAGNOSIS — E7800 Pure hypercholesterolemia, unspecified: Secondary | ICD-10-CM | POA: Diagnosis present

## 2017-11-12 DIAGNOSIS — J45909 Unspecified asthma, uncomplicated: Secondary | ICD-10-CM | POA: Diagnosis present

## 2017-11-12 DIAGNOSIS — Z79899 Other long term (current) drug therapy: Secondary | ICD-10-CM | POA: Diagnosis not present

## 2017-11-12 DIAGNOSIS — Z8249 Family history of ischemic heart disease and other diseases of the circulatory system: Secondary | ICD-10-CM | POA: Diagnosis not present

## 2017-11-12 DIAGNOSIS — Z981 Arthrodesis status: Secondary | ICD-10-CM | POA: Diagnosis not present

## 2017-11-12 DIAGNOSIS — Z881 Allergy status to other antibiotic agents status: Secondary | ICD-10-CM | POA: Diagnosis not present

## 2017-11-12 DIAGNOSIS — X58XXXA Exposure to other specified factors, initial encounter: Secondary | ICD-10-CM | POA: Diagnosis not present

## 2017-11-12 DIAGNOSIS — H8109 Meniere's disease, unspecified ear: Secondary | ICD-10-CM | POA: Diagnosis present

## 2017-11-12 DIAGNOSIS — Z800 Family history of malignant neoplasm of digestive organs: Secondary | ICD-10-CM | POA: Diagnosis not present

## 2017-11-12 DIAGNOSIS — Z96651 Presence of right artificial knee joint: Secondary | ICD-10-CM

## 2017-11-12 DIAGNOSIS — K219 Gastro-esophageal reflux disease without esophagitis: Secondary | ICD-10-CM | POA: Diagnosis present

## 2017-11-12 DIAGNOSIS — Z9889 Other specified postprocedural states: Secondary | ICD-10-CM | POA: Diagnosis not present

## 2017-11-12 DIAGNOSIS — Z888 Allergy status to other drugs, medicaments and biological substances status: Secondary | ICD-10-CM | POA: Diagnosis not present

## 2017-11-12 DIAGNOSIS — K589 Irritable bowel syndrome without diarrhea: Secondary | ICD-10-CM | POA: Diagnosis present

## 2017-11-12 DIAGNOSIS — I10 Essential (primary) hypertension: Secondary | ICD-10-CM | POA: Diagnosis present

## 2017-11-12 DIAGNOSIS — S8254XA Nondisplaced fracture of medial malleolus of right tibia, initial encounter for closed fracture: Secondary | ICD-10-CM | POA: Diagnosis not present

## 2017-11-12 DIAGNOSIS — M25561 Pain in right knee: Secondary | ICD-10-CM | POA: Diagnosis present

## 2017-11-12 HISTORY — PX: TOTAL KNEE ARTHROPLASTY: SHX125

## 2017-11-12 LAB — TYPE AND SCREEN
ABO/RH(D): A POS
Antibody Screen: NEGATIVE

## 2017-11-12 LAB — ABO/RH: ABO/RH(D): A POS

## 2017-11-12 SURGERY — ARTHROPLASTY, KNEE, TOTAL
Anesthesia: Monitor Anesthesia Care | Site: Knee | Laterality: Right

## 2017-11-12 MED ORDER — BUPIVACAINE-EPINEPHRINE (PF) 0.5% -1:200000 IJ SOLN
INTRAMUSCULAR | Status: DC | PRN
  Administered 2017-11-12: 30 mL via PERINEURAL

## 2017-11-12 MED ORDER — NEOMYCIN-POLYMYXIN B GU 40-200000 IR SOLN
Status: AC
  Filled 2017-11-12: qty 20

## 2017-11-12 MED ORDER — FENOFIBRATE 54 MG PO TABS
54.0000 mg | ORAL_TABLET | Freq: Every day | ORAL | Status: DC
  Administered 2017-11-12 – 2017-11-14 (×3): 54 mg via ORAL
  Filled 2017-11-12 (×4): qty 1

## 2017-11-12 MED ORDER — ACETAMINOPHEN 10 MG/ML IV SOLN
INTRAVENOUS | Status: DC | PRN
  Administered 2017-11-12: 1000 mg via INTRAVENOUS

## 2017-11-12 MED ORDER — BUPIVACAINE LIPOSOME 1.3 % IJ SUSP
INTRAMUSCULAR | Status: AC
  Filled 2017-11-12: qty 20

## 2017-11-12 MED ORDER — METOCLOPRAMIDE HCL 5 MG/ML IJ SOLN: 5.0000 mg | Freq: Three times a day (TID) | INTRAMUSCULAR | Status: DC | PRN

## 2017-11-12 MED ORDER — FENTANYL CITRATE (PF) 100 MCG/2ML IJ SOLN
INTRAMUSCULAR | Status: AC
  Filled 2017-11-12: qty 2

## 2017-11-12 MED ORDER — LACTATED RINGERS IV SOLN
INTRAVENOUS | Status: DC | PRN
  Administered 2017-11-12 (×2): via INTRAVENOUS

## 2017-11-12 MED ORDER — MIDAZOLAM HCL 5 MG/5ML IJ SOLN
INTRAMUSCULAR | Status: DC | PRN
  Administered 2017-11-12: 2 mg via INTRAVENOUS

## 2017-11-12 MED ORDER — DOCUSATE SODIUM 100 MG PO CAPS
100.0000 mg | ORAL_CAPSULE | Freq: Two times a day (BID) | ORAL | Status: DC
  Administered 2017-11-12: 100 mg via ORAL
  Filled 2017-11-12 (×4): qty 1

## 2017-11-12 MED ORDER — SODIUM CHLORIDE 0.9 % IV SOLN
INTRAVENOUS | Status: DC
  Administered 2017-11-12 – 2017-11-13 (×2): via INTRAVENOUS

## 2017-11-12 MED ORDER — ACETAMINOPHEN 325 MG PO TABS
325.0000 mg | ORAL_TABLET | Freq: Four times a day (QID) | ORAL | Status: DC | PRN
  Administered 2017-11-13 – 2017-11-14 (×2): 650 mg via ORAL
  Filled 2017-11-12 (×2): qty 2

## 2017-11-12 MED ORDER — TRAMADOL HCL 50 MG PO TABS
50.0000 mg | ORAL_TABLET | Freq: Four times a day (QID) | ORAL | Status: DC
  Administered 2017-11-12 – 2017-11-14 (×7): 50 mg via ORAL
  Filled 2017-11-12 (×7): qty 1

## 2017-11-12 MED ORDER — BISACODYL 10 MG RE SUPP: 10.0000 mg | Freq: Every day | RECTAL | Status: DC | PRN

## 2017-11-12 MED ORDER — OMEGA-3-ACID ETHYL ESTERS 1 G PO CAPS
1000.0000 mg | ORAL_CAPSULE | Freq: Two times a day (BID) | ORAL | Status: DC
  Administered 2017-11-12 – 2017-11-13 (×2): 1000 mg via ORAL
  Filled 2017-11-12 (×5): qty 1

## 2017-11-12 MED ORDER — LIFITEGRAST 5 % OP SOLN
1.0000 [drp] | Freq: Two times a day (BID) | OPHTHALMIC | Status: DC
  Administered 2017-11-12 – 2017-11-13 (×2): 1 [drp] via OPHTHALMIC
  Filled 2017-11-12: qty 5

## 2017-11-12 MED ORDER — KETOROLAC TROMETHAMINE 15 MG/ML IJ SOLN
INTRAMUSCULAR | Status: AC
  Administered 2017-11-12: 15 mg via INTRAVENOUS
  Filled 2017-11-12: qty 1

## 2017-11-12 MED ORDER — PROPOFOL 500 MG/50ML IV EMUL
INTRAVENOUS | Status: DC | PRN
  Administered 2017-11-12: 80 ug/kg/min via INTRAVENOUS

## 2017-11-12 MED ORDER — METOCLOPRAMIDE HCL 10 MG PO TABS: 5.0000 mg | ORAL_TABLET | Freq: Three times a day (TID) | ORAL | Status: DC | PRN

## 2017-11-12 MED ORDER — FENTANYL CITRATE (PF) 100 MCG/2ML IJ SOLN
INTRAMUSCULAR | Status: DC | PRN
  Administered 2017-11-12: 100 ug via INTRAVENOUS

## 2017-11-12 MED ORDER — FLEET ENEMA 7-19 GM/118ML RE ENEM: 1.0000 | ENEMA | Freq: Once | RECTAL | Status: DC | PRN

## 2017-11-12 MED ORDER — ONDANSETRON HCL 4 MG/2ML IJ SOLN: 4.0000 mg | Freq: Once | INTRAMUSCULAR | Status: DC | PRN

## 2017-11-12 MED ORDER — ONDANSETRON HCL 4 MG/2ML IJ SOLN
INTRAMUSCULAR | Status: DC | PRN
  Administered 2017-11-12: 4 mg via INTRAVENOUS

## 2017-11-12 MED ORDER — SCOPOLAMINE 1 MG/3DAYS TD PT72
MEDICATED_PATCH | TRANSDERMAL | Status: AC
  Administered 2017-11-12: 1.5 mg via TRANSDERMAL
  Filled 2017-11-12: qty 1

## 2017-11-12 MED ORDER — CLINDAMYCIN PHOSPHATE 900 MG/50ML IV SOLN
INTRAVENOUS | Status: AC
  Filled 2017-11-12: qty 50

## 2017-11-12 MED ORDER — PROPOFOL 10 MG/ML IV BOLUS
INTRAVENOUS | Status: AC
  Filled 2017-11-12: qty 20

## 2017-11-12 MED ORDER — FENTANYL CITRATE (PF) 100 MCG/2ML IJ SOLN: 25.0000 ug | INTRAMUSCULAR | Status: DC | PRN

## 2017-11-12 MED ORDER — ENOXAPARIN SODIUM 40 MG/0.4ML ~~LOC~~ SOLN
40.0000 mg | SUBCUTANEOUS | Status: DC
  Administered 2017-11-13 – 2017-11-14 (×2): 40 mg via SUBCUTANEOUS
  Filled 2017-11-12 (×2): qty 0.4

## 2017-11-12 MED ORDER — PROPOFOL 500 MG/50ML IV EMUL
INTRAVENOUS | Status: AC
  Filled 2017-11-12: qty 50

## 2017-11-12 MED ORDER — BUPIVACAINE-EPINEPHRINE (PF) 0.5% -1:200000 IJ SOLN
INTRAMUSCULAR | Status: AC
  Filled 2017-11-12: qty 30

## 2017-11-12 MED ORDER — ACETAMINOPHEN 10 MG/ML IV SOLN
INTRAVENOUS | Status: AC
  Filled 2017-11-12: qty 100

## 2017-11-12 MED ORDER — HYDROMORPHONE HCL 1 MG/ML IJ SOLN: 0.5000 mg | INTRAMUSCULAR | Status: DC | PRN

## 2017-11-12 MED ORDER — SCOPOLAMINE 1 MG/3DAYS TD PT72
1.0000 | MEDICATED_PATCH | Freq: Once | TRANSDERMAL | Status: DC
  Administered 2017-11-12: 1.5 mg via TRANSDERMAL

## 2017-11-12 MED ORDER — MIDAZOLAM HCL 2 MG/2ML IJ SOLN
INTRAMUSCULAR | Status: AC
  Filled 2017-11-12: qty 2

## 2017-11-12 MED ORDER — CLINDAMYCIN PHOSPHATE 900 MG/50ML IV SOLN
900.0000 mg | Freq: Four times a day (QID) | INTRAVENOUS | Status: AC
  Administered 2017-11-12 – 2017-11-13 (×3): 900 mg via INTRAVENOUS
  Filled 2017-11-12 (×3): qty 50

## 2017-11-12 MED ORDER — ADULT MULTIVITAMIN W/MINERALS CH
1.0000 | ORAL_TABLET | Freq: Every day | ORAL | Status: DC
  Filled 2017-11-12 (×2): qty 1

## 2017-11-12 MED ORDER — TRANEXAMIC ACID 1000 MG/10ML IV SOLN
INTRAVENOUS | Status: AC
  Filled 2017-11-12: qty 10

## 2017-11-12 MED ORDER — MAGNESIUM HYDROXIDE 400 MG/5ML PO SUSP
30.0000 mL | Freq: Every day | ORAL | Status: DC | PRN
  Filled 2017-11-12: qty 30

## 2017-11-12 MED ORDER — ONDANSETRON HCL 4 MG/2ML IJ SOLN: 4.0000 mg | Freq: Four times a day (QID) | INTRAMUSCULAR | Status: DC | PRN

## 2017-11-12 MED ORDER — SODIUM CHLORIDE 0.9 % IV SOLN
INTRAVENOUS | Status: DC | PRN
  Administered 2017-11-12: 60 mL

## 2017-11-12 MED ORDER — ASPIRIN 81 MG PO CHEW
81.0000 mg | CHEWABLE_TABLET | Freq: Every day | ORAL | Status: DC
  Administered 2017-11-12 – 2017-11-13 (×2): 81 mg via ORAL
  Filled 2017-11-12 (×2): qty 1

## 2017-11-12 MED ORDER — NEOMYCIN-POLYMYXIN B GU 40-200000 IR SOLN
Status: DC | PRN
  Administered 2017-11-12: 14 mL

## 2017-11-12 MED ORDER — KETOROLAC TROMETHAMINE 15 MG/ML IJ SOLN
15.0000 mg | Freq: Once | INTRAMUSCULAR | Status: AC
  Administered 2017-11-12: 15 mg via INTRAVENOUS

## 2017-11-12 MED ORDER — ONDANSETRON HCL 4 MG PO TABS
4.0000 mg | ORAL_TABLET | Freq: Four times a day (QID) | ORAL | Status: DC | PRN
  Administered 2017-11-13: 4 mg via ORAL
  Filled 2017-11-12: qty 1

## 2017-11-12 MED ORDER — TRANEXAMIC ACID 1000 MG/10ML IV SOLN
INTRAVENOUS | Status: DC | PRN
  Administered 2017-11-12: 1000 mg via INTRAVENOUS

## 2017-11-12 MED ORDER — SODIUM CHLORIDE 0.9 % IJ SOLN
INTRAMUSCULAR | Status: AC
  Filled 2017-11-12: qty 50

## 2017-11-12 MED ORDER — DIPHENHYDRAMINE HCL 12.5 MG/5ML PO ELIX: 12.5000 mg | ORAL_SOLUTION | ORAL | Status: DC | PRN

## 2017-11-12 MED ORDER — OXYCODONE HCL 5 MG PO TABS
5.0000 mg | ORAL_TABLET | ORAL | Status: DC | PRN
  Administered 2017-11-12 – 2017-11-14 (×5): 5 mg via ORAL
  Filled 2017-11-12 (×5): qty 1

## 2017-11-12 MED ORDER — BUPIVACAINE HCL (PF) 0.5 % IJ SOLN
INTRAMUSCULAR | Status: DC | PRN
  Administered 2017-11-12: 3 mL

## 2017-11-12 MED ORDER — KETOROLAC TROMETHAMINE 15 MG/ML IJ SOLN
15.0000 mg | Freq: Four times a day (QID) | INTRAMUSCULAR | Status: AC
  Administered 2017-11-12 – 2017-11-13 (×4): 15 mg via INTRAVENOUS
  Filled 2017-11-12 (×4): qty 1

## 2017-11-12 MED ORDER — HYDROCHLOROTHIAZIDE 25 MG PO TABS
12.5000 mg | ORAL_TABLET | Freq: Every day | ORAL | Status: DC
  Filled 2017-11-12 (×3): qty 1

## 2017-11-12 MED ORDER — LORATADINE 10 MG PO TABS
10.0000 mg | ORAL_TABLET | Freq: Every day | ORAL | Status: DC
  Filled 2017-11-12 (×3): qty 1

## 2017-11-12 MED ORDER — ACETAMINOPHEN 500 MG PO TABS
1000.0000 mg | ORAL_TABLET | Freq: Four times a day (QID) | ORAL | Status: AC
  Administered 2017-11-12 – 2017-11-13 (×3): 1000 mg via ORAL
  Filled 2017-11-12 (×4): qty 2

## 2017-11-12 MED ORDER — PANTOPRAZOLE SODIUM 40 MG PO TBEC
40.0000 mg | DELAYED_RELEASE_TABLET | Freq: Every day | ORAL | Status: DC
  Administered 2017-11-13 – 2017-11-14 (×2): 40 mg via ORAL
  Filled 2017-11-12 (×2): qty 1

## 2017-11-12 MED ORDER — PHENYLEPHRINE HCL 10 MG/ML IJ SOLN
INTRAMUSCULAR | Status: DC | PRN
  Administered 2017-11-12: 50 ug via INTRAVENOUS
  Administered 2017-11-12: 100 ug via INTRAVENOUS
  Administered 2017-11-12: 50 ug via INTRAVENOUS

## 2017-11-12 MED ORDER — LACTATED RINGERS IV SOLN
INTRAVENOUS | Status: DC
  Administered 2017-11-12: 08:00:00 via INTRAVENOUS

## 2017-11-12 SURGICAL SUPPLY — 58 items
BANDAGE ELASTIC 6 LF NS (GAUZE/BANDAGES/DRESSINGS) ×2 IMPLANT
BEARING TIBIAL AS KNEE 10M 67M (Knees) ×2 IMPLANT
BLADE SAW SAG 25X90X1.19 (BLADE) ×2 IMPLANT
BLADE SURG SZ20 CARB STEEL (BLADE) ×2 IMPLANT
CANISTER SUCT 1200ML W/VALVE (MISCELLANEOUS) ×2 IMPLANT
CANISTER SUCT 3000ML PPV (MISCELLANEOUS) ×2 IMPLANT
CEMENT BONE R 1X40 (Cement) ×4 IMPLANT
CEMENT VACUUM MIXING SYSTEM (MISCELLANEOUS) ×2 IMPLANT
CHLORAPREP W/TINT 26ML (MISCELLANEOUS) ×2 IMPLANT
COOLER POLAR GLACIER W/PUMP (MISCELLANEOUS) ×2 IMPLANT
COVER MAYO STAND STRL (DRAPES) ×2 IMPLANT
CUFF TOURN 24 STER (MISCELLANEOUS) IMPLANT
CUFF TOURN 30 STER DUAL PORT (MISCELLANEOUS) ×2 IMPLANT
DRAPE IMP U-DRAPE 54X76 (DRAPES) ×2 IMPLANT
DRAPE INCISE IOBAN 66X45 STRL (DRAPES) ×2 IMPLANT
DRAPE SHEET LG 3/4 BI-LAMINATE (DRAPES) ×2 IMPLANT
DRSG OPSITE POSTOP 4X10 (GAUZE/BANDAGES/DRESSINGS) ×2 IMPLANT
DRSG OPSITE POSTOP 4X8 (GAUZE/BANDAGES/DRESSINGS) IMPLANT
ELECT CAUTERY BLADE 6.4 (BLADE) ×2 IMPLANT
ELECT REM PT RETURN 9FT ADLT (ELECTROSURGICAL) ×2
ELECTRODE REM PT RTRN 9FT ADLT (ELECTROSURGICAL) ×1 IMPLANT
GLOVE BIO SURGEON STRL SZ7.5 (GLOVE) ×8 IMPLANT
GLOVE BIO SURGEON STRL SZ8 (GLOVE) ×8 IMPLANT
GLOVE BIOGEL PI IND STRL 8 (GLOVE) ×1 IMPLANT
GLOVE BIOGEL PI INDICATOR 8 (GLOVE) ×1
GLOVE INDICATOR 8.0 STRL GRN (GLOVE) ×2 IMPLANT
GOWN STRL REUS W/ TWL LRG LVL3 (GOWN DISPOSABLE) ×1 IMPLANT
GOWN STRL REUS W/ TWL XL LVL3 (GOWN DISPOSABLE) ×1 IMPLANT
GOWN STRL REUS W/TWL LRG LVL3 (GOWN DISPOSABLE) ×1
GOWN STRL REUS W/TWL XL LVL3 (GOWN DISPOSABLE) ×1
HOLDER FOLEY CATH W/STRAP (MISCELLANEOUS) ×2 IMPLANT
HOOD PEEL AWAY FLYTE STAYCOOL (MISCELLANEOUS) ×6 IMPLANT
IMMBOLIZER KNEE 19 BLUE UNIV (SOFTGOODS) ×2 IMPLANT
KIT TURNOVER KIT A (KITS) ×2 IMPLANT
KNEE CR FEMORAL RT 65MM (Femur) ×2 IMPLANT
NDL SAFETY ECLIPSE 18X1.5 (NEEDLE) ×2 IMPLANT
NEEDLE HYPO 18GX1.5 SHARP (NEEDLE) ×2
NEEDLE SPNL 20GX3.5 QUINCKE YW (NEEDLE) ×2 IMPLANT
NS IRRIG 1000ML POUR BTL (IV SOLUTION) ×2 IMPLANT
PACK TOTAL KNEE (MISCELLANEOUS) ×2 IMPLANT
PAD WRAPON POLAR KNEE (MISCELLANEOUS) ×1 IMPLANT
PATELLA STD 34X8.5 (Orthopedic Implant) ×2 IMPLANT
PLATE INTERLOK 6700 (Plate) ×2 IMPLANT
PULSAVAC PLUS IRRIG FAN TIP (DISPOSABLE) ×2
SOL .9 NS 3000ML IRR  AL (IV SOLUTION) ×1
SOL .9 NS 3000ML IRR UROMATIC (IV SOLUTION) ×1 IMPLANT
STAPLER SKIN PROX 35W (STAPLE) ×2 IMPLANT
SUCTION FRAZIER HANDLE 10FR (MISCELLANEOUS) ×1
SUCTION TUBE FRAZIER 10FR DISP (MISCELLANEOUS) ×1 IMPLANT
SUT VIC AB 0 CT1 36 (SUTURE) ×6 IMPLANT
SUT VIC AB 2-0 CT1 27 (SUTURE) ×3
SUT VIC AB 2-0 CT1 TAPERPNT 27 (SUTURE) ×3 IMPLANT
SYR 10ML LL (SYRINGE) ×2 IMPLANT
SYR 20CC LL (SYRINGE) ×2 IMPLANT
SYR 30ML LL (SYRINGE) ×6 IMPLANT
TIP FAN IRRIG PULSAVAC PLUS (DISPOSABLE) ×1 IMPLANT
TRAY FOLEY MTR SLVR 16FR STAT (SET/KITS/TRAYS/PACK) ×2 IMPLANT
WRAPON POLAR PAD KNEE (MISCELLANEOUS) ×2

## 2017-11-12 NOTE — Anesthesia Preprocedure Evaluation (Signed)
Anesthesia Evaluation  Patient identified by MRN, date of birth, ID band Patient awake    Reviewed: Allergy & Precautions, NPO status , Patient's Chart, lab work & pertinent test results  History of Anesthesia Complications (+) PONV and history of anesthetic complications  Airway Mallampati: III       Dental   Pulmonary asthma (exercise induced) , neg sleep apnea,           Cardiovascular (-) hypertension(-) Past MI and (-) CHF + dysrhythmias (benign tachycardia)      Neuro/Psych neg Seizures    GI/Hepatic Neg liver ROS, hiatal hernia, GERD  ,  Endo/Other  neg diabetes  Renal/GU negative Renal ROS     Musculoskeletal   Abdominal   Peds  Hematology   Anesthesia Other Findings   Reproductive/Obstetrics                             Anesthesia Physical Anesthesia Plan  ASA: III  Anesthesia Plan: Spinal   Post-op Pain Management:    Induction:   PONV Risk Score and Plan:   Airway Management Planned: Nasal Cannula  Additional Equipment:   Intra-op Plan:   Post-operative Plan:   Informed Consent: I have reviewed the patients History and Physical, chart, labs and discussed the procedure including the risks, benefits and alternatives for the proposed anesthesia with the patient or authorized representative who has indicated his/her understanding and acceptance.     Plan Discussed with:   Anesthesia Plan Comments:         Anesthesia Quick Evaluation

## 2017-11-12 NOTE — Progress Notes (Signed)
IS education complete, pt understands technique and reason for use. Pt inspire 2500ml+. Pt independent with use. 

## 2017-11-12 NOTE — Transfer of Care (Signed)
Immediate Anesthesia Transfer of Care Note  Patient: Teresa Guerrero  Procedure(s) Performed: TOTAL KNEE ARTHROPLASTY (Right Knee)  Patient Location: PACU  Anesthesia Type:Spinal  Level of Consciousness: awake  Airway & Oxygen Therapy: Patient Spontanous Breathing  Post-op Assessment: Report given to RN  Post vital signs: stable  Last Vitals:  Vitals Value Taken Time  BP    Temp    Pulse 69 11/12/2017 11:38 AM  Resp 11 11/12/2017 11:38 AM  SpO2 95 % 11/12/2017 11:38 AM  Vitals shown include unvalidated device data.  Last Pain:  Vitals:   11/12/17 0724  TempSrc: Oral  PainSc: 6       Patients Stated Pain Goal: 3 (11/12/17 0724)  Complications: No apparent anesthesia complications

## 2017-11-12 NOTE — H&P (Signed)
Paper H&P to be scanned into permanent record. H&P reviewed and patient re-examined. No changes. 

## 2017-11-12 NOTE — Anesthesia Post-op Follow-up Note (Signed)
Anesthesia QCDR form completed.        

## 2017-11-12 NOTE — Anesthesia Procedure Notes (Signed)
Spinal  Patient location during procedure: OR Staffing Anesthesiologist: Naomie DeanKephart, William K, MD Resident/CRNA: Camille BalSandstrom, Colbe Viviano O, CRNA Performed: resident/CRNA  Preanesthetic Checklist Completed: patient identified, site marked, surgical consent, pre-op evaluation, timeout performed, IV checked, risks and benefits discussed and monitors and equipment checked Spinal Block Patient position: sitting Prep: ChloraPrep Patient monitoring: heart rate, continuous pulse ox and blood pressure Approach: midline Location: L4-5 Injection technique: single-shot Needle Needle type: Pencil-Tip  Needle gauge: 25 G Needle length: 9 cm Needle insertion depth: 5 cm Assessment Sensory level: T8 Additional Notes Pt sitting, spinal placed per streil technique x 1 CSF clear, free flowing, 3 ml 0.5% marcaine PF injected with out difficulty pt tollerated well. Placed supine

## 2017-11-12 NOTE — Op Note (Signed)
11/12/2017  11:39 AM  Patient:   Teresa Guerrero  Pre-Op Diagnosis:   Degenerative joint disease, right knee.  Post-Op Diagnosis:   Same  Procedure:   Right TKA using all-cemented Biomet Vanguard system with a 65 mm PCR femur, a 67 mm tibial tray with a 10 mm AS E-poly insert, and a 34 x 8.5 mm all-poly 3-pegged domed patella.  Surgeon:   Maryagnes AmosJ. Jeffrey Shell Yandow, MD  Assistant:   Horris LatinoLance McGhee, PA-C   Anesthesia:   Spinal  Findings:   As above  Complications:   None  EBL:   10 cc  Fluids:   1200 cc crystalloid  UOP:   350 cc  TT:   103 minutes at 300 mmHg  Drains:   None  Closure:   Staples  Implants:   As above  Brief Clinical Note:   The patient is a 52 year old female with a long history of progressively worsening right knee pain. The patient's symptoms have progressed despite medications, activity modification, injections, etc. The patient's history and examination were consistent with advanced degenerative joint disease of the right knee confirmed by plain radiographs. The patient presents at this time for a right total knee arthroplasty.  Procedure:   The patient was brought into the operating room. After adequate spinal anesthesia was obtained, the patient was lain in the supine position. A Foley catheter was placed by the nurse before the right lower extremity was prepped with ChloraPrep solution and draped sterilely. Preoperative antibiotics were administered. After verifying the proper laterality with a surgical timeout, the limb was exsanguinated with an Esmarch and the tourniquet inflated to 300 mmHg. A curving medial approach to the knee was made through an approximately 9-10 inch incision in order to incorporate the patient's previous medial parapatellar incision. The incision was carried down through the subcutaneous tissues to expose the medial retinaculum. The lateral flap was elevated sufficiently to expose the patella tendon, the patella, and the quadriceps tendon. Care was  taken to be sure the flap was elevated and one thick layer in order to optimally maintain the blood supply to the skin flap. The medial retinaculum was incised, leaving a 3-4 mm cuff of tissue on the patella. This was extended distally along the medial border of the patellar tendon and proximally through the medial third of the quadriceps tendon. A subtotal fat pad excision was performed before the soft tissues were elevated off the anteromedial and anterolateral aspects of the proximal tibia to the level of the collateral ligaments. The anterior portions of the medial and lateral menisci were removed, as was the anterior cruciate ligament. With the knee flexed to 90, the external tibial guide was positioned and the appropriate proximal tibial cut made. This piece was taken to the back table where it was measured and found to be optimally replicated by a 67 mm component.  Attention was directed to the distal femur. The intramedullary canal was accessed through a 3/8" drill hole. The intramedullary guide was inserted and position in order to obtain a neutral flexion gap. The intercondylar block was positioned with care taken to avoid notching the anterior cortex of the femur. The appropriate cut was made. Next, the distal cutting block was placed at 6 of valgus alignment. Using the 9 mm slot, the distal cut was made. The distal femur was measured and found to be optimally replicated by the 65 mm component. The 65 mm 4-in-1 cutting block was positioned and first the posterior, then the posterior chamfer, the anterior  chamfer, femoral and intercondylar cuts were made. At this point, the posterior portions medial and lateral menisci were removed. A trial reduction was performed using the appropriate femoral and tibial components with the 10 mm insert. This demonstrated excellent stability to varus and valgus stressing both in flexion and extension while permitting full extension. Patella tracking was assessed and  found to be excellent. Therefore, the tibial guide position was marked on the proximal tibia. The patella thickness was measured and found to be 24 mm. Therefore, the appropriate cut was made. The patellar surface was measured and found to be optimally replicated by the 34 mm component. The three peg holes were drilled in place before the trial button was inserted. Patella tracking was assessed and found to be excellent, passing the "no thumb test". The lug holes were drilled into the distal femur before the trial component was removed, leaving only the tibial tray. The keel was then created using the appropriate tower, reamer, and punch.  The bony surfaces were prepared for cementing by irrigating thoroughly with bacitracin saline solution. A bone plug was fashioned from some of the bone that had been removed previously and used to plug the distal femoral canal. In addition, 20 cc of Exparel diluted out to 60 cc with normal saline and 30 cc of 0.5% Sensorcaine were injected into the postero-medial and postero-lateral aspects of the knee, the medial and lateral gutter regions, and the peri-incisional tissues to help with postoperative analgesia. Meanwhile, the cement was being mixed on the back table. When it was ready, the tibial tray was cemented in first. The excess cement was removed using Personal assistant. Next, the femoral component was impacted into place. Again, the excess cement was removed using Personal assistant. The 10 mm trial insert was positioned and the knee brought into extension while the cement hardened. Finally, the patella was cemented into place and secured using the patellar clamp. Again, the excess cement was removed using Personal assistant. Once the cement had hardened, the knee was placed through a range of motion with the findings as described above. Therefore, the trial insert was removed and, after verifying that no cement had been retained posteriorly, the permanent 10 mm anterior  stabilized E-polyethylene insert was positioned and secured using the appropriate key locking mechanism. Again the knee was placed through a range of motion with the findings as described above.  The wound was copiously irrigated with bacitracin saline solution using the jet lavage system before the quadriceps tendon and retinacular layer were reapproximated using #0 Vicryl interrupted sutures. A total of 10 cc of transexemic acid (TXA) was injected intra-articularly before the subcutaneous tissues were closed in several layers using 2-0 Vicryl interrupted sutures. The skin was closed using staples. A sterile honeycomb dressing was applied to the skin before the leg was wrapped with an Ace wrap to accommodate the Polar Care device. The patient was then awakened and returned to the recovery room in satisfactory condition after tolerating the procedure well.

## 2017-11-12 NOTE — Evaluation (Signed)
Physical Therapy Evaluation Patient Details Name: Teresa Guerrero MRN: [id number] DOB: May 25, 1965 Today's Date: 11/12/2017   History of Present Illness  Pt admitted for R TKR. On post op imaging, noted nondisplaced fx on medial tibal metaphysis. RN informed and discussed with MD. Per MD, pt to keep KI donned for all mobility and PWB. Orders updated in EMR.   Clinical Impression  Pt is a pleasant 52 year old female who was admitted for R TKR. Pt performs bed mobility with cga, transfers with cga, and ambulation with min assist and RW. Due to dizziness symptoms, didn't perform ambulation this date and assisted pt back supine. Pt very eager to participate. Sensation to B LE intact. Pt demonstrates deficits with strength/ROM/balance/mobility. Pt demonstrates inability to perform 10 SLRs with independence, therefore does require KI for mobility. Would benefit from skilled PT to address above deficits and promote optimal return to PLOF. Recommend transition to HHPT upon discharge from acute hospitalization.       Follow Up Recommendations Home health PT    Equipment Recommendations  3in1 (PT)    Recommendations for Other Services       Precautions / Restrictions Precautions Precautions: Fall;Knee Precaution Booklet Issued: No Restrictions Weight Bearing Restrictions: Yes RLE Weight Bearing: Partial weight bearing RLE Partial Weight Bearing Percentage or Pounds: 50      Mobility  Bed Mobility Overal bed mobility: Needs Assistance Bed Mobility: Supine to Sit     Supine to sit: Min guard     General bed mobility comments: safe technique performed with upright posture once seated at EOB. Follows verbal commands well  Transfers Overall transfer level: Needs assistance Equipment used: Rolling walker (2 wheeled) Transfers: Sit to/from Stand Sit to Stand: Min guard         General transfer comment: Educated on [place] status prior to standing. Also requires cues for hand placement.  Once standing, episode of dizziness and slight knee buckling noted. Assisted pt back to seated position and eventually back supine.  Ambulation/Gait Ambulation/Gait assistance: Min assist Gait Distance (Feet): 1 Feet Assistive device: Rolling walker (2 wheeled) Gait Pattern/deviations: Step-to pattern     General Gait Details: attempted ambulation, however pt became extremely dizzy and assisted back to bed. RN notified and will assess vitals. Unsafe to perform further ambulation  Stairs            Wheelchair Mobility    Modified Rankin (Stroke Patients Only)       Balance Overall balance assessment: Needs assistance Sitting-balance support: Feet supported Sitting balance-Leahy Scale: Good     Standing balance support: Bilateral upper extremity supported Standing balance-Leahy Scale: Good                               Pertinent Vitals/Pain Pain Assessment: No/denies pain    Home Living Family/patient expects to be discharged to:: Private residence Living Arrangements: Spouse/significant other Available Help at Discharge: Family Type of Home: House Home Access: Stairs to enter Entrance Stairs-Rails: Left Entrance Stairs-Number of Steps: 3 Home Layout: One level Home Equipment: Environmental consultant - 2 wheels      Prior Function Level of Independence: Independent         Comments: active and independent prior to admission     Hand Dominance        Extremity/Trunk Assessment   Upper Extremity Assessment Upper Extremity Assessment: Overall WFL for tasks assessed    Lower Extremity Assessment Lower Extremity Assessment: Generalized  weakness(R LE grossly 2/5; L LE grossly 4+/5)       Communication   Communication: No difficulties  Cognition Arousal/Alertness: Awake/alert Behavior During Therapy: WFL for tasks assessed/performed Overall Cognitive Status: Within Functional Limits for tasks assessed                                         General Comments      Exercises Total Joint Exercises Goniometric ROM: R knee AROM: 5-85 degrees Other Exercises Other Exercises: Pt performed supine ther-ex including R LE ankle pumps, quad sets, hip abd/add, and SLRs. All ther-ex performed x 10 reps with cga and safe technique Other Exercises: Pt currently on CPM, educated on don/doffing of machine and KI   Assessment/Plan    PT Assessment Patient needs continued PT services  PT Problem List Decreased strength;Decreased range of motion;Decreased activity tolerance;Decreased balance;Decreased mobility;Pain       PT Treatment Interventions DME instruction;Gait training;Stair training;Therapeutic exercise;Balance training    PT Goals (Current goals can be found in the Care Plan section)  Acute Rehab PT Goals Patient Stated Goal: to go home PT Goal Formulation: With patient Time For Goal Achievement: 11/26/17 Potential to Achieve Goals: Good    Frequency BID   Barriers to discharge        Co-evaluation               AM-PAC PT "6 Clicks" Daily Activity  Outcome Measure Difficulty turning over in bed (including adjusting bedclothes, sheets and blankets)?: Unable Difficulty moving from lying on back to sitting on the side of the bed? : Unable Difficulty sitting down on and standing up from a chair with arms (e.g., wheelchair, bedside commode, etc,.)?: Unable Help needed moving to and from a bed to chair (including a wheelchair)?: A Little Help needed walking in hospital room?: A Lot Help needed climbing 3-5 steps with a railing? : A Lot 6 Click Score: 10    End of Session Equipment Utilized During Treatment: Gait belt;Right knee immobilizer Activity Tolerance: Patient tolerated treatment well Patient left: in bed;with bed alarm set;with family/visitor present;with SCD's reapplied;with nursing/sitter in room Nurse Communication: Mobility status;Weight bearing status PT Visit Diagnosis: Muscle weakness  (generalized) (M62.81);Difficulty in walking, not elsewhere classified (R26.2);Pain Pain - Right/Left: Right Pain - part of body: Knee    Time: [id number] PT Time Calculation (min) (ACUTE ONLY): 43 min   Charges:   PT Evaluation $PT Eval Low Complexity: 1 Low PT Treatments $Therapeutic Exercise: 8-22 mins $Therapeutic Activity: 8-22 mins        Elizabeth PalauStephanie Clarisa Danser, PT, DPT [phone]   Lysandra Loughmiller 11/12/2017, 4:49 PM

## 2017-11-12 NOTE — [place] FL2 (Signed)
[place] MEDICAID FL2 LEVEL OF CARE SCREENING TOOL     IDENTIFICATION  Patient Name: Teresa Guerrero Birthdate: Aug 01, 1965 Sex: female Admission Date (Current Location): 11/12/2017  [place]   Facility and Address:  [facility], [address], [place], [place] [address]      Provider Number: [id number]  Attending Physician Name and Address:  Christena FlakePoggi, John J, MD  Relative Name and Phone Number:  Berniece PapMicheal Strider- Spouse [phone]    Current Level of Care: Hospital Recommended Level of Care: Skilled Nursing Facility Prior Approval Number:    Date Approved/Denied:   PASRR Number: [id number]  Discharge Plan: SNF    Current Diagnoses: Patient Active Problem List   Diagnosis Date Noted  . Status post total knee replacement using cement, right 11/12/2017  . Preoperative clearance 08/31/2015  . Frequent PVCs 08/27/2015  . Tachycardia 07/16/2015  . Heart palpitations 07/16/2015  . Preventative health care 07/16/2015  . Pain in left upper arm 07/11/2015  . Foot pain, left 07/03/2015  . Left shoulder pain 07/03/2015  . Menopausal syndrome (hot flashes) 10/17/2014  . History of postmenopausal HRT 10/17/2014  . Irritable bowel syndrome (IBS) 08/15/2014  . GERD (gastroesophageal reflux disease) 08/14/2014  . Climacteric syndrome 08/14/2014    Orientation RESPIRATION BLADDER Height & Weight     Self, Time, Place, Situation  Normal Continent Weight: 217 lb (98.4 kg) Height:  5\' 5"  (165.1 cm)  BEHAVIORAL SYMPTOMS/MOOD NEUROLOGICAL BOWEL NUTRITION STATUS  (none) (None) Continent Diet(Clear Liquid to be advanced )  AMBULATORY STATUS COMMUNICATION OF NEEDS Skin   Extensive Assist Verbally Surgical wounds(Incision right knee)                       Personal Care Assistance Level of Assistance  Bathing, Feeding, Dressing Bathing Assistance: Limited assistance Feeding assistance: Independent Dressing Assistance: Limited  assistance     Functional Limitations Info  Sight, Hearing, Speech Sight Info: Adequate Hearing Info: Adequate Speech Info: Adequate    SPECIAL CARE FACTORS FREQUENCY  PT (By licensed PT), OT (By licensed OT)     PT Frequency: 5 OT Frequency: 5            Contractures Contractures Info: Not present    Additional Factors Info  Code Status, Allergies Code Status Info: Full Code  Allergies Info: Formaldehyde, Kefzol Cefazolin, Penicillin G, Penicillins, Prednisone, Azithromycin, Ciprofloxacin           Current Medications (11/12/2017):  This is the current hospital active medication list Current Facility-Administered Medications  Medication Dose Route Frequency Provider Last Rate Last Dose  . 0.9 %  sodium chloride infusion   Intravenous Continuous Poggi, Excell SeltzerJohn J, MD 75 mL/hr at 11/12/17 1302    . acetaminophen (TYLENOL) tablet 1,000 mg  1,000 mg Oral Q6H Poggi, Excell SeltzerJohn J, MD   1,000 mg at 11/12/17 1346  . [START ON 11/13/2017] acetaminophen (TYLENOL) tablet 325-650 mg  325-650 mg Oral Q6H PRN Poggi, Excell SeltzerJohn J, MD      . aspirin chewable tablet 81 mg  81 mg Oral QHS Poggi, Excell SeltzerJohn J, MD      . bisacodyl (DULCOLAX) suppository 10 mg  10 mg Rectal Daily PRN Poggi, Excell SeltzerJohn J, MD      . clindamycin (CLEOCIN) IVPB 900 mg  900 mg Intravenous Q6H Poggi, Excell SeltzerJohn J, MD      . diphenhydrAMINE (BENADRYL) 12.5 MG/5ML elixir 12.5-25 mg  12.5-25 mg Oral Q4H PRN Poggi, Excell SeltzerJohn J, MD      .  docusate sodium (COLACE) capsule 100 mg  100 mg Oral BID Christena Flake, MD   100 mg at 11/12/17 1345  . [START ON 11/13/2017] enoxaparin (LOVENOX) injection 40 mg  40 mg Subcutaneous Q24H Poggi, Excell Seltzer, MD      . fenofibrate tablet 54 mg  54 mg Oral Daily Poggi, Excell Seltzer, MD      . hydrochlorothiazide (HYDRODIURIL) tablet 12.5 mg  12.5 mg Oral Daily Poggi, Excell Seltzer, MD      . HYDROmorphone (DILAUDID) injection 0.5-1 mg  0.5-1 mg Intravenous Q4H PRN Poggi, Excell Seltzer, MD      . ketorolac (TORADOL) 15 MG/ML injection 15 mg  15 mg  Intravenous Q6H Poggi, Excell Seltzer, MD   15 mg at 11/12/17 1345  . Lifitegrast 5 % SOLN 1 drop  1 drop Both Eyes BID Poggi, Excell Seltzer, MD      . loratadine (CLARITIN) tablet 10 mg  10 mg Oral Daily Poggi, Excell Seltzer, MD      . magnesium hydroxide (MILK OF MAGNESIA) suspension 30 mL  30 mL Oral Daily PRN Poggi, Excell Seltzer, MD      . metoCLOPramide (REGLAN) tablet 5-10 mg  5-10 mg Oral Q8H PRN Poggi, Excell Seltzer, MD       Or  . metoCLOPramide (REGLAN) injection 5-10 mg  5-10 mg Intravenous Q8H PRN Poggi, Excell Seltzer, MD      . multivitamin with minerals tablet 1 tablet  1 tablet Oral Daily Poggi, Excell Seltzer, MD      . omega-3 acid ethyl esters (LOVAZA) capsule 1,000 mg  1,000 mg Oral BID Poggi, Excell Seltzer, MD      . ondansetron (ZOFRAN) tablet 4 mg  4 mg Oral Q6H PRN Poggi, Excell Seltzer, MD       Or  . ondansetron (ZOFRAN) injection 4 mg  4 mg Intravenous Q6H PRN Poggi, Excell Seltzer, MD      . oxyCODONE (Oxy IR/ROXICODONE) immediate release tablet 5-10 mg  5-10 mg Oral Q4H PRN Poggi, Excell Seltzer, MD      . Melene Muller ON 11/13/2017] pantoprazole (PROTONIX) EC tablet 40 mg  40 mg Oral Daily Poggi, Excell Seltzer, MD      . scopolamine (TRANSDERM-SCOP) 1 MG/3DAYS 1.5 mg  1 patch Transdermal Once Lenard Simmer, MD   1.5 mg at 11/12/17 0743  . sodium phosphate (FLEET) 7-19 GM/118ML enema 1 enema  1 enema Rectal Once PRN Poggi, Excell Seltzer, MD      . traMADol Janean Sark) tablet 50 mg  50 mg Oral Q6H Poggi, Excell Seltzer, MD   50 mg at 11/12/17 1345     Discharge Medications: Please see discharge summary for a list of discharge medications.  Relevant Imaging Results:  Relevant Lab Results:   Additional Information SSN [id number]  Ruthe Mannan, [place]

## 2017-11-12 NOTE — Progress Notes (Signed)
New Admit  Mental Orientation: A&OX4 Assessment: Pt VSS, in no acute distress. Denies pain. Dressing intact to surgical site. Pt has decreased sensation to lower extremities due to spinal. Will continue to monitor. Admission: Complete 1A Orientation: oriented to unit and call bell system, educated on pt safety. Family: at bedside, updated

## 2017-11-13 ENCOUNTER — Encounter: Payer: Self-pay | Admitting: Surgery

## 2017-11-13 LAB — BASIC METABOLIC PANEL
Anion gap: 3 — ABNORMAL LOW (ref 5–15)
BUN: 13 mg/dL (ref 6–20)
CALCIUM: 8.9 mg/dL (ref 8.9–10.3)
CHLORIDE: 108 mmol/L (ref 98–111)
CO2: 29 mmol/L (ref 22–32)
CREATININE: 0.93 mg/dL (ref 0.44–1.00)
GFR calc non Af Amer: >60 mL/min (ref >60)
Glucose, Bld: 99 mg/dL (ref 70–99)
Potassium: 3.8 mmol/L (ref 3.5–5.1)
SODIUM: 140 mmol/L (ref 135–145)

## 2017-11-13 LAB — CBC WITH DIFFERENTIAL/PLATELET
BASOS PCT: 1 %
Basophils Absolute: 0 10*3/uL (ref 0–0.1)
EOS ABS: 0.2 10*3/uL (ref 0–0.7)
Eosinophils Relative: 4 %
HEMATOCRIT: 37.7 % (ref 35.0–47.0)
Hemoglobin: 12.9 g/dL (ref 12.0–16.0)
LYMPHS ABS: 1.7 10*3/uL (ref 1.0–3.6)
Lymphocytes Relative: 27 %
MCH: 31.5 pg (ref 26.0–34.0)
MCHC: 34.3 g/dL (ref 32.0–36.0)
MCV: 91.9 fL (ref 80.0–100.0)
MONO ABS: 0.4 10*3/uL (ref 0.2–0.9)
MONOS PCT: 7 %
Neutro Abs: 3.9 10*3/uL (ref 1.4–6.5)
Neutrophils Relative %: 61 %
Platelets: 197 10*3/uL (ref 150–440)
RBC: 4.11 MIL/uL (ref 3.80–5.20)
RDW: 14.2 % (ref 11.5–14.5)
WBC: 6.3 10*3/uL (ref 3.6–11.0)

## 2017-11-13 MED ORDER — ENOXAPARIN SODIUM 40 MG/0.4ML ~~LOC~~ SOLN: 40.0000 mg | SUBCUTANEOUS | 0 refills | Status: DC

## 2017-11-13 MED ORDER — OXYCODONE HCL 5 MG PO TABS: 5.0000 mg | ORAL_TABLET | ORAL | 0 refills | Status: DC | PRN

## 2017-11-13 MED ORDER — TRAMADOL HCL 50 MG PO TABS: 50.0000 mg | ORAL_TABLET | Freq: Four times a day (QID) | ORAL | 0 refills | Status: DC | PRN

## 2017-11-13 NOTE — Anesthesia Postprocedure Evaluation (Signed)
Anesthesia Post Note  Patient: Teresa Guerrero  Procedure(s) Performed: TOTAL KNEE ARTHROPLASTY (Right Knee)  Patient location during evaluation: Nursing Unit Anesthesia Type: Spinal Level of consciousness: oriented and awake and alert Pain management: pain level controlled Vital Signs Assessment: post-procedure vital signs reviewed and stable Respiratory status: spontaneous breathing, respiratory function stable and patient connected to nasal cannula oxygen Cardiovascular status: stable Postop Assessment: no headache, no backache and no apparent nausea or vomiting Anesthetic complications: no     Last Vitals:  Vitals:   11/13/17 0431 11/13/17 0756  BP: 125/77 136/74  Pulse: 60 72  Resp: 19 18  Temp: 36.6 C 36.9 C  SpO2: 99% 100%    Last Pain:  Vitals:   11/13/17 0805  TempSrc:   PainSc: 4                  KEPHART,WILLIAM K

## 2017-11-13 NOTE — Progress Notes (Signed)
   Subjective: 1 Day Post-Op Procedure(s) (LRB): TOTAL KNEE ARTHROPLASTY (Right) Patient reports pain as moderate.   Patient is well, and has had no acute complaints or problems We will start therapy today.  Plan is to go Home after hospital stay. no nausea and no vomiting Patient denies any chest pains or shortness of breath. Objective: Vital signs in last 24 hours: Temp:  [97 F (36.1 C)-98.4 F (36.9 C)] 98.4 F (36.9 C) (09/13 0756) Pulse Rate:  [54-72] 72 (09/13 0756) Resp:  [12-19] 18 (09/13 0756) BP: (91-136)/(59-77) 136/74 (09/13 0756) SpO2:  [95 %-100 %] 100 % (09/13 0756) Weight:  [98.4 kg] 98.4 kg (09/12 1243) Heels are non tender and elevated off the bed using rolled towels Intake/Output from previous day: 09/12 0701 - 09/13 0700 In: 1780.6 [P.O.:240; I.V.:1440.6; IV Piggyback:100] Out: 2020 [Urine:2010; Blood:10] Intake/Output this shift: No intake/output data recorded.  Recent Labs    11/13/17 0442  HGB 12.9   Recent Labs    11/13/17 0442  WBC 6.3  RBC 4.11  HCT 37.7  PLT 197   Recent Labs    11/13/17 0442  NA 140  K 3.8  CL 108  CO2 29  BUN 13  CREATININE 0.93  GLUCOSE 99  CALCIUM 8.9   No results for input(s): LABPT, INR in the last 72 hours.  EXAM General - Patient is Alert, Appropriate and Oriented Extremity - Neurologically intact Neurovascular intact Sensation intact distally Intact pulses distally Dorsiflexion/Plantar flexion intact Compartment soft Dressing - dressing C/D/I Motor Function - intact, moving foot and toes well on exam.    Past Medical History:  Diagnosis Date  . Allergic rhinitis   . Arthritis   . Asthma   . Climacteric syndrome   . Complication of anesthesia   . Diverticulosis   . Dysrhythmia    tachycardia/palpitations  . GERD (gastroesophageal reflux disease)   . History of hiatal hernia   . Hypercholesterolemia   . IBS (irritable bowel syndrome)   . Meniere disease   . PONV (postoperative nausea  and vomiting)     Assessment/Plan: 1 Day Post-Op Procedure(s) (LRB): TOTAL KNEE ARTHROPLASTY (Right) Active Problems:   Status post total knee replacement using cement, right  Estimated body mass index is 36.11 kg/m as calculated from the following:   Height as of this encounter: 5\' 5"  (1.651 m).   Weight as of this encounter: 98.4 kg. Advance diet Up with therapy D/C IV fluids Plan for discharge tomorrow Discharge home with home health  Labs: Within normal limits DVT Prophylaxis - Lovenox, TED hose and SCD 50% weightbearing right leg D/C O2 and Pulse OX and try on Room Air Begin working on bowel movement  Nicki Furlan R. [facility] PA [facility] 11/13/2017, 8:03 AM

## 2017-11-13 NOTE — Progress Notes (Signed)
Clinical Social Worker (CSW) received SNF consult. PT is recommending home health. RN case manager aware of above. Please reconsult if future social work needs arise. CSW signing off.   Hoa Deriso, LCSW [phone] 

## 2017-11-13 NOTE — Progress Notes (Signed)
Physical Therapy Treatment Patient Details Name: Teresa Guerrero MRN: [id number] DOB: February 07, 1966 Today's Date: 11/13/2017    History of Present Illness Pt admitted for R TKR. On post op imaging, noted nondisplaced fx on medial tibal metaphysis. RN informed and discussed with MD. Per MD, pt to keep KI donned for all mobility and PWB. Orders updated in EMR.     PT Comments    Pt is pleasant and shows motivation to participate. Pt was educated on HEP and all ther-ex reviewed. Pt demonstrates increased independence with ther-ex and requires less cueing to perform. She needs more assist with heel slides and SAQ due to knee stiffness. Pt was more efficient with all mobility this afternoon. During ambulation pt responded to cueing well working toward a reciprocal gait pattern. Will need to complete stair training prior to d/c.   Follow Up Recommendations  Home health PT     Equipment Recommendations  3in1 (PT)    Recommendations for Other Services       Precautions / Restrictions Precautions Precautions: Fall;Knee Precaution Booklet Issued: Yes (comment) Restrictions Weight Bearing Restrictions: Yes RLE Weight Bearing: Partial weight bearing RLE Partial Weight Bearing Percentage or Pounds: 50    Mobility  Bed Mobility Overal bed mobility: Needs Assistance Bed Mobility: Supine to Sit     Supine to sit: Min guard     General bed mobility comments: Pt shows increased efficiency while maintain safety during all bed mobility.  Transfers Overall transfer level: Needs assistance Equipment used: Rolling walker (2 wheeled) Transfers: Sit to/from Stand Sit to Stand: Min guard         General transfer comment: No dizziness reported during transfer. Pt reminded WB status prior to standing, no cues needed during transfer to maintain PWB on R LE.  Ambulation/Gait Ambulation/Gait assistance: Min guard Gait Distance (Feet): 100 Feet Assistive device: Rolling walker (2 wheeled) Gait  Pattern/deviations: Step-to pattern;Step-through pattern     General Gait Details: Pt initally amb with step-to pattern. With verbal cues pt inc L step length working toward reciprocal gait. With verbal cues pt also corrected forward gaze. No reports of dizziness   Stairs             Wheelchair Mobility    Modified Rankin (Stroke Patients Only)       Balance Overall balance assessment: Needs assistance Sitting-balance support: Feet supported Sitting balance-Leahy Scale: Good     Standing balance support: Bilateral upper extremity supported Standing balance-Leahy Scale: Good                              Cognition Arousal/Alertness: Awake/alert Behavior During Therapy: WFL for tasks assessed/performed Overall Cognitive Status: Within Functional Limits for tasks assessed                                        Exercises Other Exercises Other Exercises: Pt performed supine ther-ex including R LE ankle pumps, quad sets, AAROM (min assist) hip abd/add, AAROM (mod assist) SLRs, SAQs, and heel slides. All ther-ex performed x 12 reps with safe technique, except SAQs and heel slides were performed x 10 reps with mod assist. Other Exercises: Pt performed bathroom mobility with RW. Sit-to-stand and stand-to-sit at [employer]. Pt can maintain safety with +1 assist for tolieting activities.    General Comments        Pertinent Vitals/Pain Pain  Assessment: 0-10 Pain Score: 4  Pain Location: R knee Pain Descriptors / Indicators: Aching(At beginning of tx session) Pain Intervention(s): Monitored during session    Home Living                      Prior Function            PT Goals (current goals can now be found in the care plan section) Acute Rehab PT Goals Patient Stated Goal: to go home PT Goal Formulation: With patient Time For Goal Achievement: 11/26/17 Potential to Achieve Goals: Good Progress towards PT goals: Progressing toward  goals    Frequency    BID      PT Plan Current plan remains appropriate    Co-evaluation              AM-PAC PT "6 Clicks" Daily Activity  Outcome Measure  Difficulty turning over in bed (including adjusting bedclothes, sheets and blankets)?: Unable Difficulty moving from lying on back to sitting on the side of the bed? : Unable Difficulty sitting down on and standing up from a chair with arms (e.g., wheelchair, bedside commode, etc,.)?: Unable Help needed moving to and from a bed to chair (including a wheelchair)?: A Little Help needed walking in hospital room?: A Little Help needed climbing 3-5 steps with a railing? : A Lot 6 Click Score: 11    End of Session Equipment Utilized During Treatment: Gait belt;Right knee immobilizer Activity Tolerance: Patient tolerated treatment well Patient left: in bed;with call bell/phone within reach;with bed alarm set Nurse Communication: Mobility status PT Visit Diagnosis: Muscle weakness (generalized) (M62.81);Difficulty in walking, not elsewhere classified (R26.2);Pain Pain - Right/Left: Right Pain - part of body: Knee     Time: 0981-1914 PT Time Calculation (min) (ACUTE ONLY): 34 min  Charges:  $Gait Training: 8-22 mins $Therapeutic Exercise: 8-22 mins                     Arvilla Meres, SPT   Arvilla Meres 11/13/2017, 3:34 PM

## 2017-11-13 NOTE — Progress Notes (Signed)
Physical Therapy Treatment Patient Details Name: Teresa Guerrero MRN: [id number] DOB: 02-Apr-1965 Today's Date: 11/13/2017    History of Present Illness Pt admitted for R TKR. On post op imaging, noted nondisplaced fx on medial tibal metaphysis. RN informed and discussed with MD. Per MD, pt to keep KI donned for all mobility and PWB. Orders updated in EMR.     PT Comments    Pt is pleasant and shows motivation to participate. She demonstrates ability to perform most ther-ex with CGA, needs mod assist with SLR, lag present. Pt's knee extension has improved 1 degree, flexion is 73 degrees limited by stiffness and pain. Pt performs bed mobility with CGA. Pt reported mild dizziness with change in position, but resolved quickly. Orthostatics were monitored during position changes, measured in sitting 113/69 mmHg and 121/83 in standing.  Pt amb using RW and min assist. KI worn for mobility. Pt educated on proper technique for safe mobility with WB restrictions and need for assist with all mobility.   Follow Up Recommendations  Home health PT     Equipment Recommendations  3in1 (PT)    Recommendations for Other Services       Precautions / Restrictions Precautions Precautions: Fall;Knee Precaution Booklet Issued: No Restrictions Weight Bearing Restrictions: Yes RLE Weight Bearing: Partial weight bearing RLE Partial Weight Bearing Percentage or Pounds: 50    Mobility  Bed Mobility Overal bed mobility: Needs Assistance Bed Mobility: Supine to Sit     Supine to sit: Min guard     General bed mobility comments: safe technique performed with upright posture once seated at EOB. Follows verbal commands well  Transfers Overall transfer level: Needs assistance Equipment used: Rolling walker (2 wheeled) Transfers: Sit to/from Stand Sit to Stand: Min guard         General transfer comment: Educated on [place] status prior to standing. Also requires cues for hand placement. Once standing  dizziness occured, but resolved within 30 seconds in standing.  Ambulation/Gait Ambulation/Gait assistance: Min assist Gait Distance (Feet): 40 Feet Assistive device: Rolling walker (2 wheeled) Gait Pattern/deviations: Step-to pattern     General Gait Details: No reports of dizziness when ambulating. Pt demonstrated compliance with PWB t/o.   Stairs             Wheelchair Mobility    Modified Rankin (Stroke Patients Only)       Balance Overall balance assessment: Needs assistance Sitting-balance support: Feet supported Sitting balance-Leahy Scale: Good     Standing balance support: Bilateral upper extremity supported Standing balance-Leahy Scale: Good                              Cognition Arousal/Alertness: Awake/alert Behavior During Therapy: WFL for tasks assessed/performed Overall Cognitive Status: Within Functional Limits for tasks assessed                                        Exercises Total Joint Exercises Goniometric ROM: R knee AAROM: 4-73 degrees Other Exercises Other Exercises: Pt performed supine ther-ex including R LE ankle pumps, quad sets, AAROM (min assist) hip abd/add, AAROM (mod assist) SLRs, and SAQs. All ther-ex performed x 12 reps with safe technique, except SAQ performed x 5 reps with mod assist. Other Exercises: Pt performed seated heel slides x 6 reps with min assist and safe technique Other Exercises: Pt performed bathroom  mobility with RW. Sit-to-stand and stand-to-sit at [employer]. Pt can maintain safety with +1 assist for tolieting activities.    General Comments        Pertinent Vitals/Pain Pain Assessment: 0-10 Pain Score: 3  Pain Location: R knee Pain Descriptors / Indicators: Guarding Pain Intervention(s): Monitored during session    Home Living                      Prior Function            PT Goals (current goals can now be found in the care plan section) Acute Rehab PT  Goals Patient Stated Goal: to go home PT Goal Formulation: With patient Time For Goal Achievement: 11/26/17 Potential to Achieve Goals: Good Progress towards PT goals: Progressing toward goals    Frequency    BID      PT Plan Current plan remains appropriate    Co-evaluation              AM-PAC PT "6 Clicks" Daily Activity  Outcome Measure  Difficulty turning over in bed (including adjusting bedclothes, sheets and blankets)?: Unable Difficulty moving from lying on back to sitting on the side of the bed? : Unable Difficulty sitting down on and standing up from a chair with arms (e.g., wheelchair, bedside commode, etc,.)?: Unable Help needed moving to and from a bed to chair (including a wheelchair)?: A Little Help needed walking in hospital room?: A Lot Help needed climbing 3-5 steps with a railing? : A Lot 6 Click Score: 10    End of Session Equipment Utilized During Treatment: Gait belt;Right knee immobilizer Activity Tolerance: Patient tolerated treatment well Patient left: in chair;with call bell/phone within reach;with chair alarm set;with family/visitor present;with SCD's reapplied Nurse Communication: Mobility status;Other (comment)(Bowel movement during session) PT Visit Diagnosis: Muscle weakness (generalized) (M62.81);Difficulty in walking, not elsewhere classified (R26.2);Pain Pain - Right/Left: Right Pain - part of body: Knee     Time: 0905-0958 PT Time Calculation (min) (ACUTE ONLY): 53 min  Charges:  $Gait Training: 8-22 mins $Therapeutic Exercise: 23-37 mins $Therapeutic Activity: 8-22 mins                     Arvilla MeresSarah Thatiana Renbarger, SPT    Arvilla MeresSarah Benjaman Artman 11/13/2017, 1:05 PM

## 2017-11-14 LAB — BASIC METABOLIC PANEL
ANION GAP: 8 (ref 5–15)
BUN: 12 mg/dL (ref 6–20)
CO2: 26 mmol/L (ref 22–32)
CREATININE: 0.78 mg/dL (ref 0.44–1.00)
Calcium: 8.9 mg/dL (ref 8.9–10.3)
Chloride: 106 mmol/L (ref 98–111)
GFR calc non Af Amer: >60 mL/min (ref >60)
Glucose, Bld: 118 mg/dL — ABNORMAL HIGH (ref 70–99)
Potassium: 3.5 mmol/L (ref 3.5–5.1)
Sodium: 140 mmol/L (ref 135–145)

## 2017-11-14 NOTE — Progress Notes (Signed)
   Subjective: 2 Days Post-Op Procedure(s) (LRB): TOTAL KNEE ARTHROPLASTY (Right) Patient reports pain as mild to moderate.   Patient is well, and has had no acute complaints or problems We will start therapy today.  She is to wear the knee immobilizer when standing and walking.  It can be removed with physical therapy for range of motion. Plan is to go Home after hospital stay. no nausea and no vomiting Patient denies any chest pains or shortness of breath.  Objective: Vital signs in last 24 hours: Temp:  [98.4 F (36.9 C)] 98.4 F (36.9 C) (09/13 2330) Pulse Rate:  [70-72] 70 (09/13 2330) Resp:  [16-18] 16 (09/13 2330) BP: (136-143)/(74-78) 143/78 (09/13 2330) SpO2:  [95 %-100 %] 95 % (09/13 2330) Heels are non tender and elevated off the bed using rolled towels Intake/Output from previous day: 09/13 0701 - 09/14 0700 In: 480 [P.O.:480] Out: -  Intake/Output this shift: No intake/output data recorded.  Recent Labs    11/13/17 0442  HGB 12.9   Recent Labs    11/13/17 0442  WBC 6.3  RBC 4.11  HCT 37.7  PLT 197   Recent Labs    11/13/17 0442 11/14/17 0408  NA 140 140  K 3.8 3.5  CL 108 106  CO2 29 26  BUN 13 12  CREATININE 0.93 0.78  GLUCOSE 99 118*  CALCIUM 8.9 8.9   No results for input(s): LABPT, INR in the last 72 hours.  EXAM General - Patient is Alert, Appropriate and Oriented Extremity - Neurologically intact Neurovascular intact Sensation intact distally Intact pulses distally Dorsiflexion/Plantar flexion intact Compartment soft Dressing - dressing C/D/I.  The surgical Ace wrap was removed.  The knee immobilizer was reapplied. Motor Function - intact, moving foot and toes well on exam.  The patient ambulated 100 feet with physical therapy  Past Medical History:  Diagnosis Date  . Allergic rhinitis   . Arthritis   . Asthma   . Climacteric syndrome   . Complication of anesthesia   . Diverticulosis   . Dysrhythmia    tachycardia/palpitations  . GERD (gastroesophageal reflux disease)   . History of hiatal hernia   . Hypercholesterolemia   . IBS (irritable bowel syndrome)   . Meniere disease   . PONV (postoperative nausea and vomiting)     Assessment/Plan: 2 Days Post-Op Procedure(s) (LRB): TOTAL KNEE ARTHROPLASTY (Right) Active Problems:   Status post total knee replacement using cement, right  Estimated body mass index is 36.11 kg/m as calculated from the following:   Height as of this encounter: 5\' 5"  (1.651 m).   Weight as of this encounter: 98.4 kg.  Continue physical therapy. Discharge home today. Normal diet Knee immobilizer will be worn at all times, and less physical therapy working on range of motion and bathing. [facility]  Labs: Within normal limits DVT Prophylaxis - Lovenox, TED hose and SCD 50% weightbearing right leg  Dedra Skeensodd Treveon Bourcier PA-C [facility] 11/14/2017, 6:50 AM

## 2017-11-14 NOTE — Progress Notes (Signed)
Physical Therapy Treatment Patient Details Name: Teresa Guerrero MRN: [id number] DOB: [birth year] Today's Date: 11/14/2017    History of Present Illness Pt admitted for R TKR. On post op imaging, noted nondisplaced fx on medial tibal metaphysis. RN informed and discussed with MD. Per MD, pt to keep KI donned for all mobility and PWB. Orders updated in EMR.     PT Comments    .Participated in exercises as described below.  To edge of bed with min guard and self assist to get LE to edge of bed.  She was able to stand and ambulate slowly to PT gym for stair training then return to room with short seated rest breaks.  Returned to bed with min a for LE management.  Overall did well with no further questions or concerns for therapy regarding mobility/discharge.   Follow Up Recommendations  Home health PT     Equipment Recommendations  3in1 (PT);Rolling walker with 5" wheels    Recommendations for Other Services       Precautions / Restrictions Precautions Precautions: Fall;Knee Restrictions Weight Bearing Restrictions: Yes RLE Weight Bearing: Partial weight bearing RLE Partial Weight Bearing Percentage or Pounds: 50    Mobility  Bed Mobility Overal bed mobility: Needs Assistance Bed Mobility: Supine to Sit;Sit to Supine     Supine to sit: Min guard Sit to supine: Min assist      Transfers Overall transfer level: Needs assistance Equipment used: Rolling walker (2 wheeled) Transfers: Sit to/from Stand Sit to Stand: Supervision;Min guard            Ambulation/Gait Ambulation/Gait assistance: Supervision;Min guard Gait Distance (Feet): 160 Feet Assistive device: Rolling walker (2 wheeled) Gait Pattern/deviations: Step-to pattern   Gait velocity interpretation: 1.31 - 2.62 ft/sec, indicative of limited community ambulator     Stairs Stairs: Yes Stairs assistance: Min guard;Min assist Stair Management: One rail Left Number of Stairs: 4 General stair comments: did  well with Left rail only   Wheelchair Mobility    Modified Rankin (Stroke Patients Only)       Balance Overall balance assessment: Needs assistance Sitting-balance support: Feet supported Sitting balance-Leahy Scale: Good     Standing balance support: Bilateral upper extremity supported Standing balance-Leahy Scale: Good                              Cognition Arousal/Alertness: Awake/alert Behavior During Therapy: WFL for tasks assessed/performed Overall Cognitive Status: Within Functional Limits for tasks assessed                                        Exercises Total Joint Exercises Goniometric ROM: 4-72 - limited by pain Other Exercises Other Exercises: Pt performed supine ther-ex including R LE ankle pumps, quad sets, AAROM (min assist) hip abd/add, AAROM (mod assist) SLRs, SAQs, and heel slides. All ther-ex performed x 12 reps with safe technique, except SAQs and heel slides were performed x 10 reps with mod assist.    General Comments        Pertinent Vitals/Pain Pain Assessment: 0-10 Pain Score: 3  Pain Location: R knee Pain Descriptors / Indicators: Sore;Operative site guarding Pain Intervention(s): Premedicated before session;Limited activity within patient's tolerance;Monitored during session;Ice applied    Home Living  Prior Function            PT Goals (current goals can now be found in the care plan section) Progress towards PT goals: Progressing toward goals    Frequency    BID      PT Plan Current plan remains appropriate    Co-evaluation              AM-PAC PT "6 Clicks" Daily Activity  Outcome Measure  Difficulty turning over in bed (including adjusting bedclothes, sheets and blankets)?: None Difficulty moving from lying on back to sitting on the side of the bed? : None Difficulty sitting down on and standing up from a chair with arms (e.g., wheelchair, bedside  commode, etc,.)?: A Little Help needed moving to and from a bed to chair (including a wheelchair)?: A Little Help needed walking in hospital room?: A Little Help needed climbing 3-5 steps with a railing? : A Little 6 Click Score: 20    End of Session Equipment Utilized During Treatment: Gait belt;Right knee immobilizer Activity Tolerance: Patient tolerated treatment well Patient left: in bed;with call bell/phone within reach;with bed alarm set;with family/visitor present   Pain - Right/Left: Right Pain - part of body: Knee     Time: 0915-0959 PT Time Calculation (min) (ACUTE ONLY): 44 min  Charges:  $Gait Training: 23-37 mins $Therapeutic Exercise: 8-22 mins                    Danielle DessSarah Gerri Acre, PTA 11/14/17, 10:10 AM

## 2017-11-14 NOTE — Progress Notes (Signed)
DISCHARGE NOTE:  Pt given discharge instructions and prescriptions (oxycodone, tramadol). Pt verbalized understanding. Surgical dressing dry and intact. TED hose on both legs, Knee immobilizer on, Bedside commode sent with pt. Husband at bedside. Pt wheeled to car by staff.

## 2017-11-14 NOTE — Discharge Instructions (Signed)
TOTAL KNEE REPLACEMENT POSTOPERATIVE DIRECTIONS  Knee Rehabilitation, Guidelines Following Surgery  Results after knee surgery are often greatly improved when you follow the exercise, range of motion and muscle strengthening exercises prescribed by your doctor. Safety measures are also important to protect the knee from further injury. Any time any of these exercises cause you to have increased pain or swelling in your knee joint, decrease the amount until you are comfortable again and slowly increase them. If you have problems or questions, call your caregiver or physical therapist for advice.   HOME CARE INSTRUCTIONS  Remove items at home which could result in a fall. This includes throw rugs or furniture in walking pathways.   ICE using the Polar Care unit to the affected knee every three hours for 30 minutes at a time and then as needed for pain and swelling.  Place a dry towel or pillow case over the knee before applying the Polar Care Unit.  Continue to use ice on the knee for pain and swelling from surgery. You may notice swelling that will progress down to the foot and ankle.  This is normal after surgery.  Elevate the leg when you are not up walking on it.    Continue to use the breathing machine which will help keep your temperature down.  It is common for your temperature to cycle up and down following surgery, especially at night when you are not up moving around and exerting yourself.  The breathing machine keeps your lungs expanded and your temperature down.  Do not place pillow under knee, focus on keeping the knee straight while resting  DIET You may resume your previous home diet once your are discharged from the hospital.  DRESSING / WOUND CARE / SHOWERING Keep the surgical dressing until follow up.  The dressing is water proof, so you can shower without any extra covering.  IF THE DRESSING FALLS OFF or the wound gets wet inside, change the dressing with sterile gauze.  Please use  good hand washing techniques before changing the dressing.  Do not use any lotions or creams on the incision until instructed by your surgeon.   You need to keep your wound dry after being discharged home.  Just keep the incision dry and apply a dry gauze dressing on daily. Change the surgical dressing only if needed and reapply a dry dressing each time.  ACTIVITY Walk with your walker as instructed. Use walker as long as suggested by your caregivers. Avoid periods of inactivity such as sitting longer than an hour when not asleep. This helps prevent blood clots.  You may resume a sexual relationship in one month or when given the OK by your doctor.  You may return to work once you are cleared by your doctor.  Do not drive a car for 6 weeks or until released by you surgeon.  Do not drive while taking narcotics.  WEIGHT BEARING Partial weight bearing with assist device as directed.  Knee immobilizer on right leg with any ambulation.  POSTOPERATIVE CONSTIPATION PROTOCOL Constipation - defined medically as fewer than three stools per week and severe constipation as less than one stool per week.  One of the most common issues patients have following surgery is constipation.  Even if you have a regular bowel pattern at home, your normal regimen is likely to be disrupted due to multiple reasons following surgery.  Combination of anesthesia, postoperative narcotics, change in appetite and fluid intake all can affect your bowels.  In  order to avoid complications following surgery, here are some recommendations in order to help you during your recovery period.  Colace (docusate) - Pick up an over-the-counter form of Colace or another stool softener and take twice a day as long as you are requiring postoperative pain medications.  Take with a full glass of water daily.  If you experience loose stools or diarrhea, hold the colace until you stool forms back up.  If your symptoms do not get better within 1  week or if they get worse, check with your doctor.  Dulcolax (bisacodyl) - Pick up over-the-counter and take as directed by the product packaging as needed to assist with the movement of your bowels.  Take with a full glass of water.  Use this product as needed if not relieved by Colace only.   MiraLax (polyethylene glycol) - Pick up over-the-counter to have on hand.  MiraLax is a solution that will increase the amount of water in your bowels to assist with bowel movements.  Take as directed and can mix with a glass of water, juice, soda, coffee, or tea.  Take if you go more than two days without a movement. Do not use MiraLax more than once per day. Call your doctor if you are still constipated or irregular after using this medication for 7 days in a row.  If you continue to have problems with postoperative constipation, please contact the office for further assistance and recommendations.  If you experience "the worst abdominal pain ever" or develop nausea or vomiting, please contact the office immediatly for further recommendations for treatment.  ITCHING  If you experience itching with your medications, try taking only a single pain pill, or even half a pain pill at a time.  You can also use Benadryl over the counter for itching or also to help with sleep.   TED HOSE STOCKINGS Wear the elastic stockings on both legs for six weeks following surgery during the day but you may remove then at night for sleeping.  MEDICATIONS See your medication summary on the After Visit Summary that the nursing staff will review with you prior to discharge.  You may have some home medications which will be placed on hold until you complete the course of blood thinner medication.  It is important for you to complete the blood thinner medication as prescribed by your surgeon.  Continue your approved medications as instructed at time of discharge.  PRECAUTIONS If you experience chest pain or shortness of breath -  call 911 immediately for transfer to the hospital emergency department.  If you develop a fever greater that 101 F, purulent drainage from wound, increased redness or drainage from wound, foul odor from the wound/dressing, or calf pain - CONTACT YOUR SURGEON.                                                   FOLLOW-UP APPOINTMENTS Make sure you keep all of your appointments after your operation with your surgeon and caregivers. You should call the office at the above phone number and make an appointment for approximately two weeks after the date of your surgery or on the date instructed by your surgeon outlined in the "After Visit Summary".   RANGE OF MOTION AND STRENGTHENING EXERCISES  Rehabilitation of the knee is important following a knee injury or an  operation. After just a few days of immobilization, the muscles of the thigh which control the knee become weakened and shrink (atrophy). Knee exercises are designed to build up the tone and strength of the thigh muscles and to improve knee motion. Often times heat used for twenty to thirty minutes before working out will loosen up your tissues and help with improving the range of motion but do not use heat for the first two weeks following surgery. These exercises can be done on a training (exercise) mat, on the floor, on a table or on a bed. Use what ever works the best and is most comfortable for you Knee exercises include:  Leg Lifts - While your knee is still immobilized in a splint or cast, you can do straight leg raises. Lift the leg to 60 degrees, hold for 3 sec, and slowly lower the leg. Repeat 10-20 times 2-3 times daily. Perform this exercise against resistance later as your knee gets better.  Quad and Hamstring Sets - Tighten up the muscle on the front of the thigh (Quad) and hold for 5-10 sec. Repeat this 10-20 times hourly. Hamstring sets are done by pushing the foot backward against an object and holding for 5-10 sec. Repeat as with quad  sets.   Leg Slides: Lying on your back, slowly slide your foot toward your buttocks, bending your knee up off the floor (only go as far as is comfortable). Then slowly slide your foot back down until your leg is flat on the floor again.  Angel Wings: Lying on your back spread your legs to the side as far apart as you can without causing discomfort.  A rehabilitation program following serious knee injuries can speed recovery and prevent re-injury in the future due to weakened muscles. Contact your doctor or a physical therapist for more information on knee rehabilitation.   IF YOU ARE TRANSFERRED TO A SKILLED REHAB FACILITY If the patient is transferred to a skilled rehab facility following release from the hospital, a list of the current medications will be sent to the facility for the patient to continue.  When discharged from the skilled rehab facility, please have the facility set up the patient's Home Health Physical Therapy prior to being released. Also, the skilled facility will be responsible for providing the patient with their medications at time of release from the facility to include their pain medication, the muscle relaxants, and their blood thinner medication. If the patient is still at the rehab facility at time of the two week follow up appointment, the skilled rehab facility will also need to assist the patient in arranging follow up appointment in our office and any transportation needs.  MAKE SURE YOU:  Understand these instructions.  Get help right away if you are not doing well or get worse.    Pick up stool softner and laxative for home use following surgery while on pain medications. Do not submerge incision under water. Please use good hand washing techniques while changing dressing each day. May shower starting three days after surgery. Please use a clean towel to pat the incision dry following showers. Continue to use ice for pain and swelling after surgery. Do not use  any lotions or creams on the incision until instructed by your surgeon.

## 2017-11-14 NOTE — Discharge Summary (Signed)
Physician Discharge Summary  Subjective: 2 Days Post-Op Procedure(s) (LRB): TOTAL KNEE ARTHROPLASTY (Right) Patient reports pain as moderate.   Patient seen in rounds with Dr. Joice Lofts. Patient is well, and has had no acute complaints or problems Patient is ready to go home with home health physical therapy  Physician Discharge Summary  Patient ID: Teresa Guerrero MRN: [id number] DOB/AGE: April 25, 1965 52 y.o.  Admit date: 11/12/2017 Discharge date: 11/14/2017  Admission Diagnoses:  Discharge Diagnoses:  Active Problems:   Status post total knee replacement using cement, right   Discharged Condition: fair  Hospital Course: The patient is postop day 2 from a right total knee replacement.  The patient had been doing well with pain control.  The patient did have x-rays postoperative revealing a nondisplaced medial tibial fracture and has been in a knee immobilizer for any ambulation.  She has been working on range of motion.  She ambulated over 100 feet yesterday.  She is doing well and ready to go home.  She has had a bowel movement.  Treatments: surgery:  Right TKA using all-cemented [employer] Vanguard system with a 65 mm PCR femur, a 67 mm tibial tray with a 10 mm AS E-poly insert, and a 34 x 8.5 mm all-poly 3-pegged domed patella.  Surgeon:   Maryagnes Amos, MD  Assistant:   Horris Latino, PA-C   Anesthesia:   Spinal  Findings:   As above  Complications:    Postoperative x-rays revealed nondisplaced medial tibial fracture  EBL:   10 cc  Fluids:   1200 cc crystalloid  UOP:   350 cc  TT:   103 minutes at 300 mmHg  Drains:   None  Closure:   Staples  Implants:   As above  Discharge Exam: Blood pressure (!) 143/78, pulse 70, temperature 98.4 F (36.9 C), temperature source Oral, resp. rate 16, height 5\' 5"  (1.651 m), weight 98.4 kg, last menstrual period 03/03/2010, SpO2 95 %.   Disposition: Discharge disposition: 01-Home or Self Care       Discharge  Instructions    Increase activity slowly   Complete by:  As directed      Allergies as of 11/14/2017      Reactions   Formaldehyde Hives, Rash   Kefzol [cefazolin] Anaphylaxis, Cough   Other reaction(s): Other (See Comments)   Penicillin G Anaphylaxis   Has patient had a PCN reaction causing immediate rash, facial/tongue/throat swelling, SOB or lightheadedness with hypotension: Yes Has patient had a PCN reaction causing severe rash involving mucus membranes or skin necrosis: No Has patient had a PCN reaction that required hospitalization No Has patient had a PCN reaction occurring within the last 10 years: Yes If all of the above answers are "NO", then may proceed with Cephalosporin use.   Penicillins Anaphylaxis   Prednisone Anaphylaxis   Azithromycin Rash   Ciprofloxacin Rash      Medication List    STOP taking these medications   aspirin 81 MG tablet   Fish Oil 1000 MG Caps     TAKE these medications   acetaminophen 500 MG tablet Commonly known as:  TYLENOL Take 1,000 mg by mouth 3 (three) times daily.   desloratadine 5 MG tablet Commonly known as:  CLARINEX Take 5 mg by mouth daily as needed (ALLERGIES.).   enoxaparin 40 MG/0.4ML injection Commonly known as:  LOVENOX Inject 0.4 mLs (40 mg total) into the skin daily for 14 days. Start taking on:  11/15/2017   EPIPEN 2-PAK 0.3 mg/0.3  mL Soaj injection Generic drug:  EPINEPHrine Inject 0.3 mg into the muscle daily as needed (FOR ANAPHYLACTIC ALLERGIC REACTIONS).   fenofibrate 48 MG tablet Commonly known as:  TRICOR Take 48 mg by mouth daily.   hydrochlorothiazide 12.5 MG tablet Commonly known as:  HYDRODIURIL Take 12.5 mg by mouth daily.   multivitamin-iron-minerals-folic acid chewable tablet Chew 1 tablet by mouth daily.   oxyCODONE 5 MG immediate release tablet Commonly known as:  Oxy IR/ROXICODONE Take 1-2 tablets (5-10 mg total) by mouth every 4 (four) hours as needed for moderate pain (pain score  4-6).   pantoprazole 40 MG tablet Commonly known as:  PROTONIX Take 1 tablet (40 mg total) by mouth daily.   traMADol 50 MG tablet Commonly known as:  ULTRAM Take 1-2 tablets (50-100 mg total) by mouth every 6 (six) hours as needed.   triamcinolone cream 0.1 % Commonly known as:  KENALOG Apply 1 application topically 2 (two) times daily as needed for rash. AFFECTED AREAS OF ARM/BACK   XIIDRA 5 % Soln Generic drug:  Lifitegrast Place 1 drop into both eyes 2 (two) times daily.      Follow-up Information    Poggi, Excell SeltzerJohn J, MD Follow up in 2 week(s).   Specialty:  Surgery Contact information: [address] [facility] [place] [address] [phone]           Signed: Lenard ForthMUNDY, Afua Hoots 11/14/2017, 6:55 AM   Objective: Vital signs in last 24 hours: Temp:  [98.4 F (36.9 C)] 98.4 F (36.9 C) (09/13 2330) Pulse Rate:  [70-72] 70 (09/13 2330) Resp:  [16-18] 16 (09/13 2330) BP: (136-143)/(74-78) 143/78 (09/13 2330) SpO2:  [95 %-100 %] 95 % (09/13 2330)  Intake/Output from previous day:  Intake/Output Summary (Last 24 hours) at 11/14/2017 0655 Last data filed at 11/13/2017 1840 Gross per 24 hour  Intake 480 ml  Output -  Net 480 ml    Intake/Output this shift: No intake/output data recorded.  Labs: Recent Labs    11/13/17 0442  HGB 12.9   Recent Labs    11/13/17 0442  WBC 6.3  RBC 4.11  HCT 37.7  PLT 197   Recent Labs    11/13/17 0442 11/14/17 0408  NA 140 140  K 3.8 3.5  CL 108 106  CO2 29 26  BUN 13 12  CREATININE 0.93 0.78  GLUCOSE 99 118*  CALCIUM 8.9 8.9   No results for input(s): LABPT, INR in the last 72 hours.  EXAM: General - Patient is Alert and Oriented Extremity - Sensation intact distally Dorsiflexion/Plantar flexion intact No cellulitis present Compartment soft Incision - clean, dry, no drainage Motor Function -plantar flexion and dorsiflexion intact.  Ambulated 100 feet.  Assessment/Plan: 2 Days Post-Op  Procedure(s) (LRB): TOTAL KNEE ARTHROPLASTY (Right) Procedure(s) (LRB): TOTAL KNEE ARTHROPLASTY (Right) Past Medical History:  Diagnosis Date  . Allergic rhinitis   . Arthritis   . Asthma   . Climacteric syndrome   . Complication of anesthesia   . Diverticulosis   . Dysrhythmia    tachycardia/palpitations  . GERD (gastroesophageal reflux disease)   . History of hiatal hernia   . Hypercholesterolemia   . IBS (irritable bowel syndrome)   . Meniere disease   . PONV (postoperative nausea and vomiting)    Active Problems:   Status post total knee replacement using cement, right  Estimated body mass index is 36.11 kg/m as calculated from the following:   Height as of this encounter: 5\' 5"  (  1.651 m).   Weight as of this encounter: 98.4 kg. Advance diet Up with therapy D/C IV fluids Discharge home with home health Diet - Regular diet Follow up - in 2 weeks Activity - PWB Disposition - Home Condition Upon Discharge - Stable DVT Prophylaxis - Lovenox and TED hose  Dedra Skeens, PA-C Orthopaedic Surgery 11/14/2017, 6:55 AM

## 2017-11-14 NOTE — Care Management Note (Signed)
Case Management Note  Patient Details  Name: Teresa RampMary Guerrero MRN: [id number] Date of Birth: 01-26-1966  Subjective/Objective:   Patient to be discharged per MD order. Orders in place for home health services. Patient is POD1 Knee replacement. Agreeable to home health services. Given choice would like to use [facility] as her spouse has used them in the past. Referral placed with Rosey Batheresa from [facility] for PT services. DME need for 3in1. Ordered from [facility], awaiting delivery. Family to provide transport. No further RNCM needs.  Buddy DutyJosh Esteven Overfelt RN BSN RNCM [phone]                   Action/Plan:   Expected Discharge Date:  11/14/17               Expected Discharge Plan:  Home w Home Health Services  In-House Referral:     Discharge planning Services  CM Consult  Post Acute Care Choice:  Durable Medical Equipment, Home Health Choice offered to:  Patient  DME Arranged:  3-N-1 DME Agency:  [facility]  HH Arranged:  PT HH Agency[facility]  [facility]  Status of Service:  Completed, signed off  If discussed at [employer] Meetings, dates discussed:    Additional Comments:  Virgel ManifoldJosh A Ami Mally, RN 11/14/2017, 8:41 AM

## 2017-11-20 ENCOUNTER — Telehealth: Payer: Self-pay

## 2017-11-20 NOTE — Telephone Encounter (Signed)
Flagged on EMMI report for having other questions or problems.  Spoke with patient who mentioned she does not have any at this time.  She did offer feedback that she felt the robo calls checking on someone's health were distasteful and states she hung up on second call before completion.  I thanked her for her feedback and for participating in the calls.

## 2018-03-09 ENCOUNTER — Other Ambulatory Visit: Payer: Self-pay | Admitting: Family

## 2018-03-09 DIAGNOSIS — Z1231 Encounter for screening mammogram for malignant neoplasm of breast: Secondary | ICD-10-CM

## 2018-03-16 ENCOUNTER — Other Ambulatory Visit: Payer: Self-pay | Admitting: Family

## 2018-03-16 DIAGNOSIS — R945 Abnormal results of liver function studies: Secondary | ICD-10-CM

## 2018-03-19 ENCOUNTER — Ambulatory Visit
Admission: RE | Admit: 2018-03-19 | Discharge: 2018-03-19 | Disposition: A | Discharge status: Home / Self Care | Payer: Commercial Managed Care - PPO | Source: Ambulatory Visit | Attending: Family | Admitting: Family

## 2018-03-19 DIAGNOSIS — R945 Abnormal results of liver function studies: Secondary | ICD-10-CM | POA: Insufficient documentation

## 2018-03-23 ENCOUNTER — Encounter: Payer: Commercial Managed Care - PPO | Admitting: Obstetrics and Gynecology

## 2018-03-23 ENCOUNTER — Encounter: Payer: Self-pay | Admitting: Obstetrics and Gynecology

## 2018-04-20 ENCOUNTER — Ambulatory Visit
Admission: RE | Admit: 2018-04-20 | Discharge: 2018-04-20 | Disposition: A | Discharge status: Home / Self Care | Payer: Commercial Managed Care - PPO | Source: Ambulatory Visit | Attending: Family | Admitting: Family

## 2018-04-20 DIAGNOSIS — Z1231 Encounter for screening mammogram for malignant neoplasm of breast: Secondary | ICD-10-CM | POA: Insufficient documentation

## 2018-06-22 ENCOUNTER — Other Ambulatory Visit: Payer: Self-pay | Admitting: Nurse Practitioner

## 2018-06-22 DIAGNOSIS — R1310 Dysphagia, unspecified: Secondary | ICD-10-CM

## 2018-06-22 DIAGNOSIS — K219 Gastro-esophageal reflux disease without esophagitis: Secondary | ICD-10-CM

## 2018-06-25 ENCOUNTER — Other Ambulatory Visit: Payer: Self-pay

## 2018-06-25 ENCOUNTER — Ambulatory Visit
Admission: RE | Admit: 2018-06-25 | Discharge: 2018-06-25 | Disposition: A | Discharge status: Home / Self Care | Payer: Commercial Managed Care - PPO | Source: Ambulatory Visit | Attending: Nurse Practitioner | Admitting: Nurse Practitioner

## 2018-06-25 DIAGNOSIS — R1310 Dysphagia, unspecified: Secondary | ICD-10-CM | POA: Insufficient documentation

## 2018-06-25 DIAGNOSIS — K219 Gastro-esophageal reflux disease without esophagitis: Secondary | ICD-10-CM | POA: Diagnosis not present

## 2018-08-04 ENCOUNTER — Telehealth: Payer: Self-pay

## 2018-08-04 NOTE — Telephone Encounter (Signed)
Spoke with patient.  Patient does not have any concerns.  Refused telehealth appt.  Would like in office appt.

## 2018-08-16 ENCOUNTER — Ambulatory Visit: Payer: Commercial Managed Care - PPO | Admitting: Cardiovascular Disease

## 2018-11-01 NOTE — Progress Notes (Signed)
Pt stated having multiply abscesses near her rectum area x 1 month off and on. Pt stated tht she has been trying spitz baths to help with the pain. Pt stated that the abscesses are painful, oozing and draining.

## 2018-11-02 ENCOUNTER — Other Ambulatory Visit: Payer: Self-pay

## 2018-11-02 ENCOUNTER — Ambulatory Visit: Payer: Commercial Managed Care - PPO | Admitting: Obstetrics and Gynecology

## 2018-11-02 ENCOUNTER — Encounter: Payer: Self-pay | Admitting: Obstetrics and Gynecology

## 2018-11-02 VITALS — BP 116/79 | HR 70 | Ht 65.0 in | Wt 219.1 lb

## 2018-11-02 DIAGNOSIS — K611 Rectal abscess: Secondary | ICD-10-CM

## 2018-11-02 MED ORDER — LIDOCAINE-PRILOCAINE 2.5-2.5 % EX CREA: 1.0000 "application " | TOPICAL_CREAM | CUTANEOUS | 0 refills | Status: AC | PRN

## 2018-11-02 MED ORDER — SULFAMETHOXAZOLE-TRIMETHOPRIM 800-160 MG PO TABS: 1.0000 | ORAL_TABLET | Freq: Two times a day (BID) | ORAL | 1 refills | Status: DC

## 2018-11-02 NOTE — Progress Notes (Signed)
    GYNECOLOGY PROGRESS NOTE  Subjective:    Patient ID: Teresa Guerrero, female    DOB: 12/31/1965, 53 y.o.   MRN: [id number]  HPI  Patient is a 53 y.o. G52P1001 female who presents for complaints of suspected multiple abscesses near the rectum.  Reports that she had one last month for which she performed home treatments (sitz baths, using a raw potato) which helped. Two week later she began to notice several more small bumps appearing (feels like they were smaller abscesses) which were painful and noted to have oozing and draining after several day. She reports that these new lesions have been ongoing now for almost 2 weeks. She does report a h/o irritable bowel syndrome. Notes using both dry and wet tissue products. Denies fevers or chills.   The following portions of the patient's history were reviewed and updated as appropriate: allergies, current medications, past family history, past medical history, past social history, past surgical history and problem list.  Review of Systems Pertinent items noted in HPI and remainder of comprehensive ROS otherwise negative.   Objective:   Blood pressure 116/79, pulse 70, height 5\' 5"  (1.651 m), weight 219 lb 1.6 oz (99.4 kg), last menstrual period 03/03/2010. General appearance: alert and no distress Abdomen: soft, non-tender; bowel sounds normal; no masses,  no organomegaly Pelvic: deferred Buttock: No lesions visible but palpable near peri-rectal area on left, several small subcutaneous nodules, mildly tender. No induration, fluctuance, or erythema present.     Assessment:   Peri-rectal abscess  Plan:  1. Peri-rectal abscess, appears to be attempting to heal as no induration or fluctuance noted, no drainage expressed on today, no scabs or crusting near affected area. No need for I&D at this time. Discussed home self-care including continuing sitz baths, use of lidocaine cream to the area of concern for comfort, and use of witch hazel to decrease  skin microbes as patient with  Recurrent abscesses. Discussed use of antibiotics. Will prescribe Bactrim.   Return to clinic for any scheduled appointments or for any gynecologic concerns as needed.    Rubie Maid, MD [facility]

## 2018-11-02 NOTE — Patient Instructions (Signed)

## 2018-11-05 ENCOUNTER — Encounter: Payer: Commercial Managed Care - PPO | Admitting: Obstetrics and Gynecology

## 2019-03-28 ENCOUNTER — Other Ambulatory Visit: Payer: Self-pay | Admitting: Internal Medicine

## 2019-03-28 DIAGNOSIS — Z1231 Encounter for screening mammogram for malignant neoplasm of breast: Secondary | ICD-10-CM

## 2019-04-25 ENCOUNTER — Other Ambulatory Visit: Payer: Self-pay

## 2019-04-25 ENCOUNTER — Ambulatory Visit
Admission: RE | Admit: 2019-04-25 | Discharge: 2019-04-25 | Disposition: A | Discharge status: Home / Self Care | Payer: Commercial Managed Care - PPO | Source: Ambulatory Visit | Attending: Internal Medicine | Admitting: Internal Medicine

## 2019-04-25 DIAGNOSIS — Z1231 Encounter for screening mammogram for malignant neoplasm of breast: Secondary | ICD-10-CM

## 2019-07-20 ENCOUNTER — Telehealth: Payer: Self-pay | Admitting: Cardiovascular Disease

## 2019-07-20 NOTE — Telephone Encounter (Signed)
Patient states she is fine and will call if needed.   Deleting recall per patient request.

## 2019-08-08 IMAGING — MR MR HEAD W/O CM
10 series · 37 of 48 positions shown · non-contrast
Comparison: None.

CLINICAL DATA: Right-sided ear pain, fullness, and slight hearing
loss. Headaches. Symptoms since [REDACTED].

EXAM:
MRI HEAD WITHOUT CONTRAST
TECHNIQUE: Multiplanar, multiecho pulse sequences of the brain and surrounding
structures were obtained without intravenous contrast.

[Series 2: T1 · sagittal · 5.0mm · 0.47mm/px · 3 of 23 slices shown (1 of 2)]
[im 1/23]
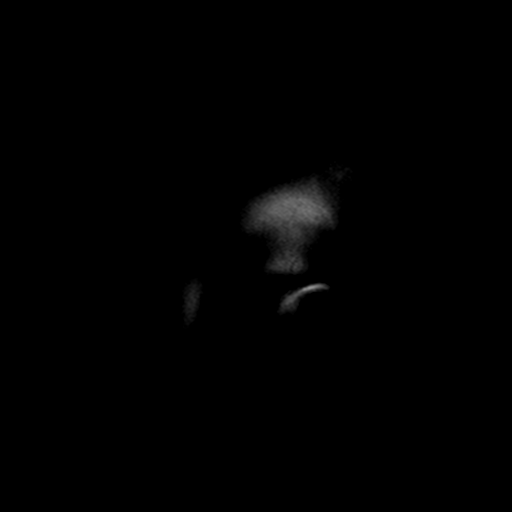
[im 12/23]
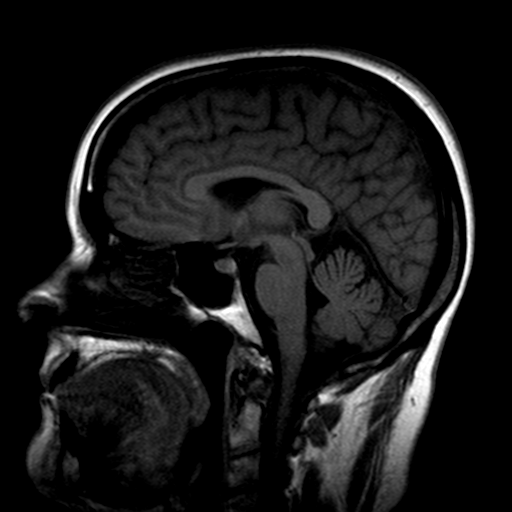
[im 23/23]
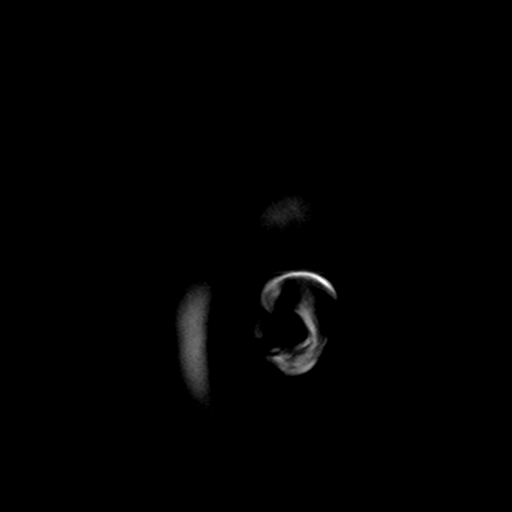

[Series 7: T2 · axial · 5.0mm · 0.45mm/px · z∈[-35,+114]mm · 2 of 23 slices shown (1 of 3)]
[im 1/23]
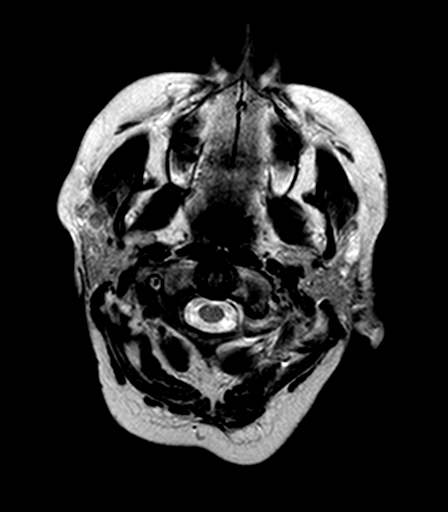
[im 23/23]
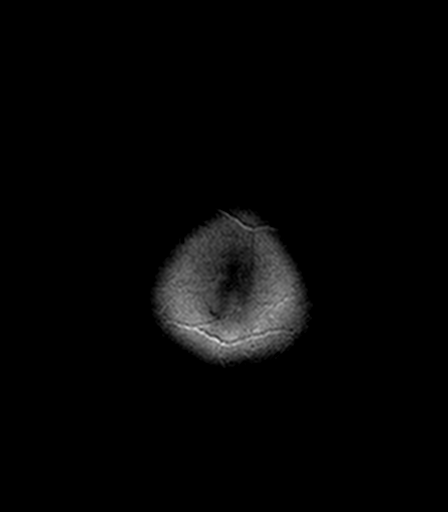

[Series 9: T2 · axial · 5.0mm · 0.45mm/px · z∈[-35,+114]mm · 2 of 23 slices shown (2 of 3)]
[im 1/23]
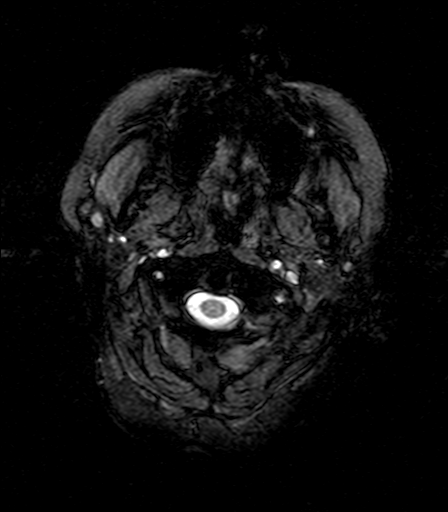
[im 23/23]
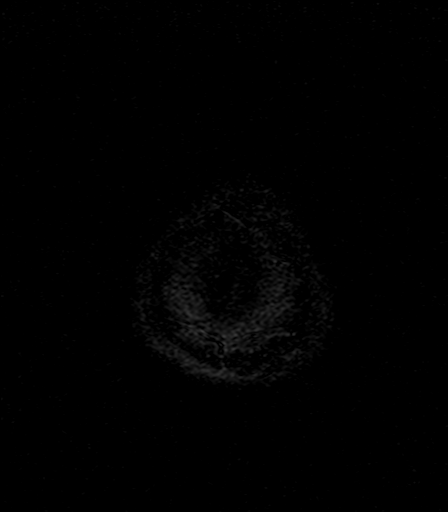

[Series 10: T1 · axial · 1.0mm · 0.45mm/px · z∈[-30,+104]mm · 8 of 160 slices shown (2 of 2)]
[im 10/160]
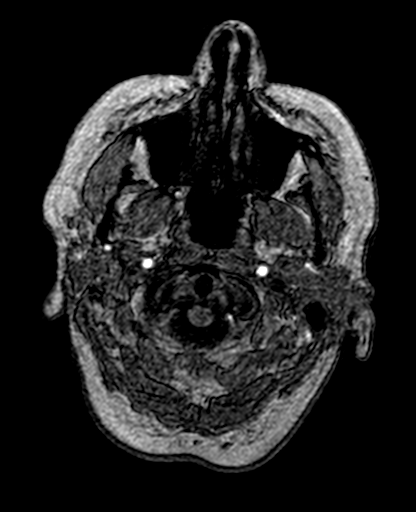
[im 30/160]
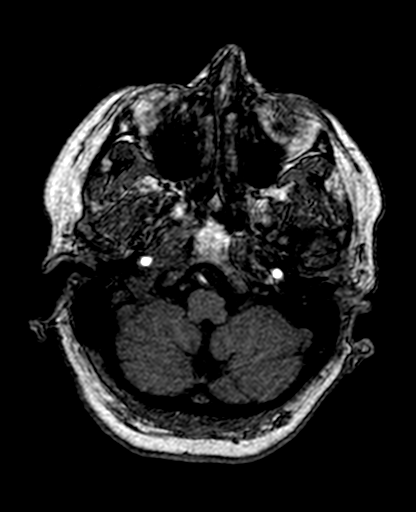
[im 50/160]
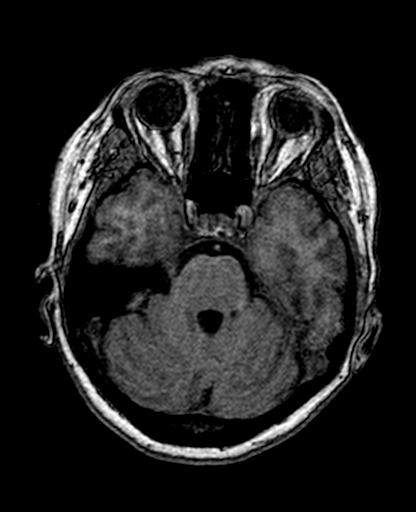
[im 70/160]
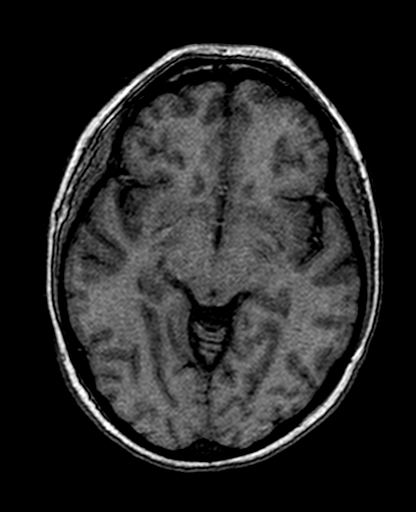
[im 90/160]
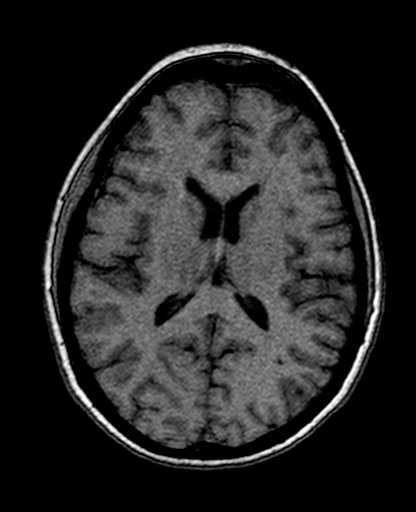
[im 110/160]
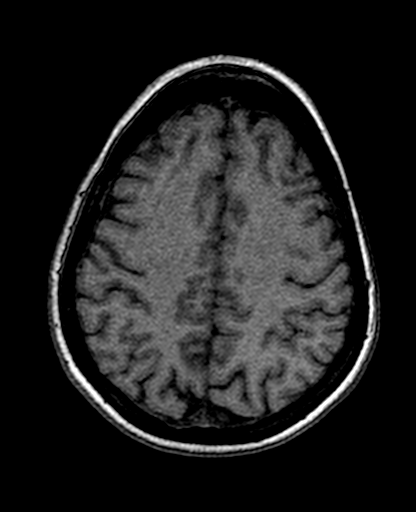
[im 130/160]
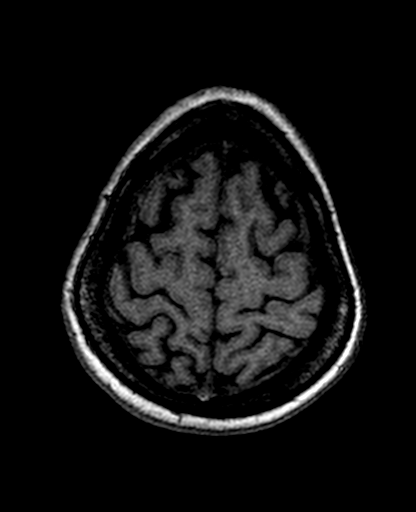
[im 150/160]
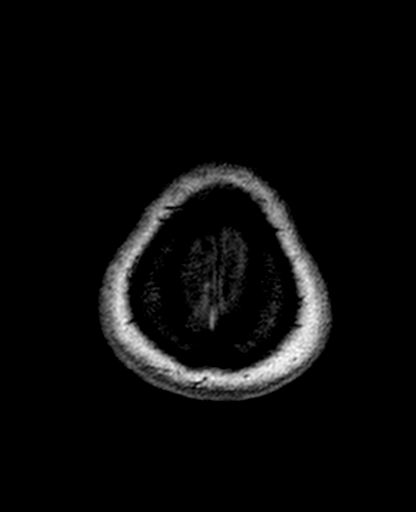

[Series 11: T2 · coronal · 5.0mm · 0.45mm/px · 3 of 29 slices shown (3 of 3)]
[im 1/29]
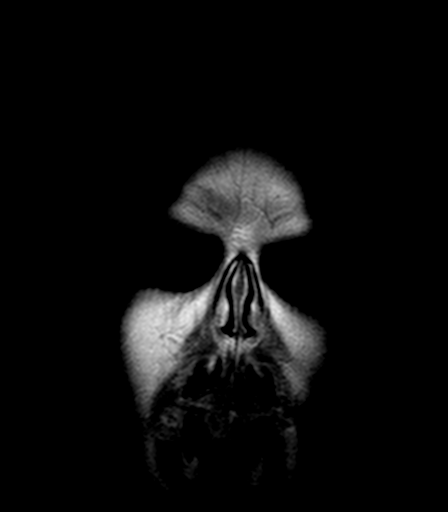
[im 15/29]
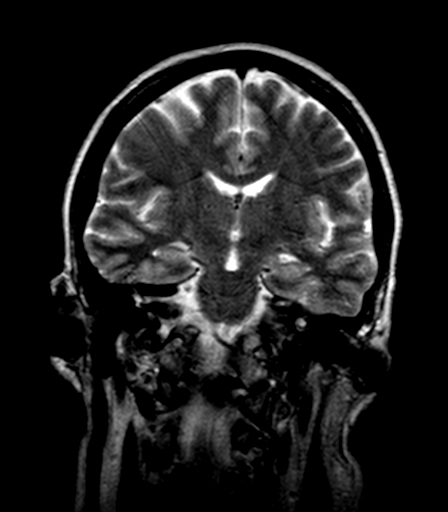
[im 29/29]
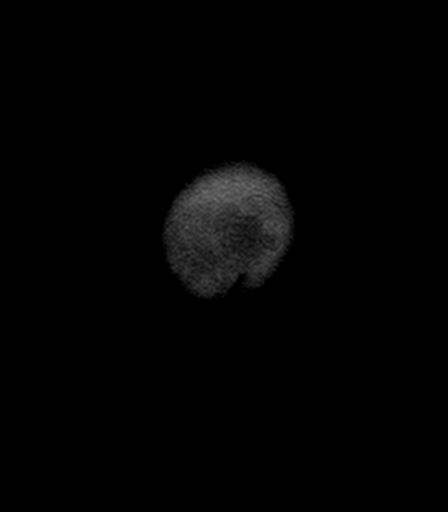

[Series 12: FLAIR · axial · 5.0mm · 0.90mm/px · z∈[-48,+115]mm · 3 of 27 slices shown]
[im 1/27]
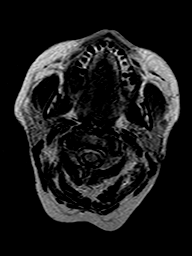
[im 14/27]
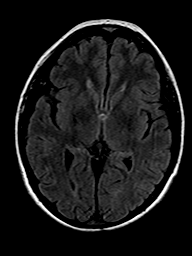
[im 27/27]
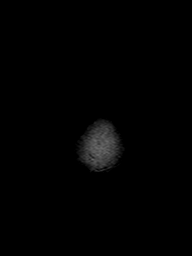

[Series 14: DWI · axial · 4.0mm · 0.94mm/px · z∈[-49,+121]mm · 5 of 45 slices shown (1 of 3)]
[im 1/45]
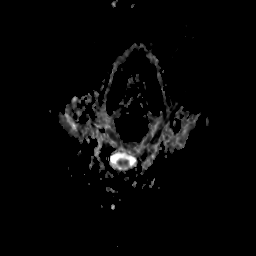
[im 12/45]
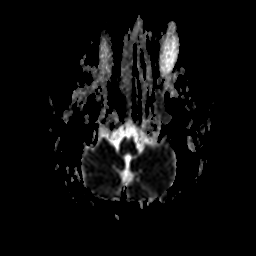
[im 23/45]
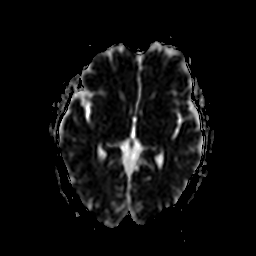
[im 34/45]
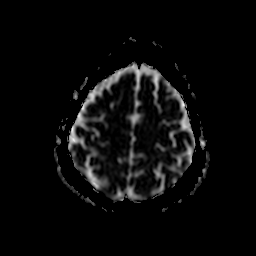
[im 45/45]
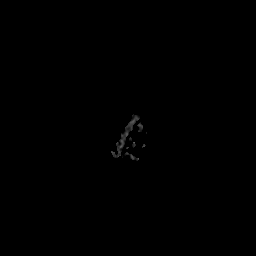

[Series 15: DWI · axial · 4.0mm · 0.94mm/px · z∈[-49,+121]mm · 5 of 45 slices shown (2 of 3)]
[im 1/45]
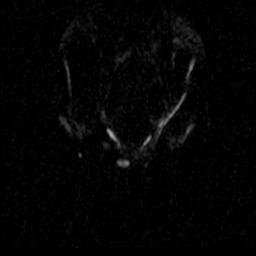
[im 12/45]
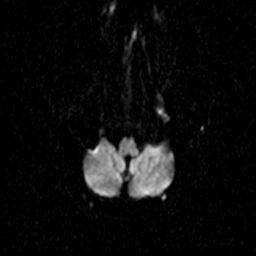
[im 23/45]
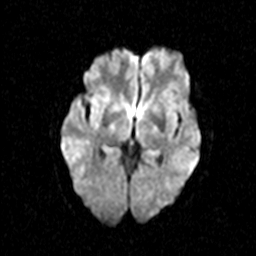
[im 34/45]
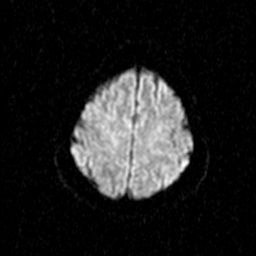
[im 45/45]
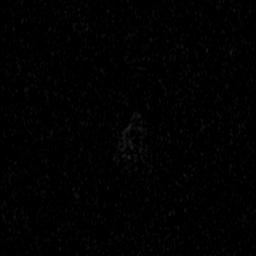

[Series 17: DWI · coronal · 5.0mm · 1.80mm/px · 4 of 39 slices shown (3 of 3)]
[im 1/39]
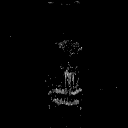
[im 13/39]
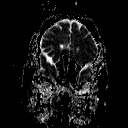
[im 26/39]
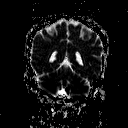
[im 39/39]
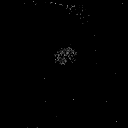

[Series 18: cor (id) · coronal · 5.0mm · 1.80mm/px · 2 of 38 slices shown]
[im 1/38]
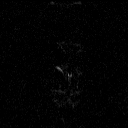
[im 13/38]
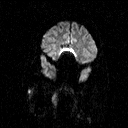

[37 of 48 positions shown; findings below may reference images not displayed]

FINDINGS: Multiple sequences are mildly motion degraded.

Brain: There is no evidence of acute infarct, intracranial
hemorrhage, mass, midline shift, or extra-axial fluid collection.
The ventricles and sulci are normal. The brain is normal in signal.

Vascular: Major intracranial vascular flow voids are preserved.

Skull and upper cervical spine: Unremarkable bone marrow signal.

Sinuses/Orbits: Unremarkable orbits. Clear paranasal sinuses and
right mastoid air cells. Trace left mastoid fluid.

Other: None.
IMPRESSION: Unremarkable appearance of the brain.

## 2020-04-18 ENCOUNTER — Encounter: Payer: Self-pay | Admitting: Obstetrics and Gynecology

## 2020-04-18 ENCOUNTER — Other Ambulatory Visit: Payer: Self-pay

## 2020-04-18 ENCOUNTER — Ambulatory Visit: Payer: Commercial Managed Care - PPO | Admitting: Obstetrics and Gynecology

## 2020-04-18 VITALS — BP 145/85 | HR 92 | Ht 65.0 in | Wt 231.1 lb

## 2020-04-18 DIAGNOSIS — K611 Rectal abscess: Secondary | ICD-10-CM | POA: Diagnosis not present

## 2020-04-18 MED ORDER — SULFAMETHOXAZOLE-TRIMETHOPRIM 800-160 MG PO TABS: 1.0000 | ORAL_TABLET | Freq: Two times a day (BID) | ORAL | 1 refills | Status: DC

## 2020-04-18 NOTE — Patient Instructions (Signed)

## 2020-04-18 NOTE — Progress Notes (Signed)
    GYNECOLOGY PROGRESS NOTE  Subjective:    Patient ID: Teresa Guerrero, female    DOB: [birth year], 55 y.o.   MRN: [id number]  HPI  Patient is a 55 y.o. G34P1001 female who presents for complaints of another abscess near the rectum. She has a history of abscesses near this area. Last one was 1.5 years ago. She has performed home treatments (sitz baths, using a raw potato) over the last 4 days but does not feel like it has helped much. Denies fevers or chills, no drainage from the area.   The following portions of the patient's history were reviewed and updated as appropriate: allergies, current medications, past family history, past medical history, past social history, past surgical history and problem list.  Review of Systems Pertinent items noted in HPI and remainder of comprehensive ROS otherwise negative.   Objective:   Blood pressure (!) 145/85, pulse 92, height 5\' 5"  (1.651 m), weight 231 lb 1.6 oz (104.8 kg), last menstrual period 03/03/2010. General appearance: alert and no distress Abdomen: soft, non-tender; bowel sounds normal; no masses,  no organomegaly Pelvic: deferred Buttock: ~ 1.5 x 1 cm lesion visible on left buttock ~ 1-2 cm from rectum, raised, mildly tender, however more firm lesion noted beneath the skin ~ 2-3 cm circumferentially.   Some induration, and fluctuance present, no erythema present. Also noted small external hemorrhoid of rectum.   Assessment:   Peri-rectal abscess   Plan:  1. Peri-rectal abscess, induration noted, partially fluctuant. Also scarring noted as this appears to be site of previous abscesses. I&D performed today with moderate drainage of purulent fluid. Discussed home self-care including continuing sitz baths, use of lidocaine cream to the area of concern for comfort, and use of witch hazel to decrease skin microbes as patient with recurrent abscesses. Will obtain culture. Will prescribe Bactrim.   Return to clinic for any scheduled appointments or  for any gynecologic concerns as needed.    Incision and Drainage Procedure Note Diagnosis: abscess Location: peri-rectal Informed Consent: Discussed risks (permanent scarring, light or dark discoloration, infection, pain, bleeding, bruising, redness, and recurrence of the lesion) and benefits of the procedure, as well as the alternatives.  Informed consent was obtained. Preparation: The area was prepped in a standard fashion with betadine. Anesthesia: Lidocaine 2% with epinephrine Procedure Details: An incision was made overlying the lesion. The lesion drained pus.  A moderate amount of fluid was drained.   The lesion was multiloculated.     Total number of lesions drained: 1 Plan: The patient was instructed on post-op care. Recommend OTC analgesia as needed for pain. Continue sitz baths and prescribed a course of Bactrim.    05/02/2010, MD [facility]

## 2020-04-18 NOTE — Progress Notes (Signed)
Pt present due to abscess/cyst area rectum area.  Pt stated that she noticed the area on Saturday x 4days. Pt stated that the area is painful and hard for her to sit down.

## 2020-04-27 LAB — ANAEROBIC AND AEROBIC CULTURE

## 2020-06-21 ENCOUNTER — Other Ambulatory Visit: Payer: Self-pay | Admitting: Internal Medicine

## 2020-06-21 DIAGNOSIS — Z1231 Encounter for screening mammogram for malignant neoplasm of breast: Secondary | ICD-10-CM

## 2020-07-02 ENCOUNTER — Ambulatory Visit
Admission: RE | Admit: 2020-07-02 | Discharge: 2020-07-02 | Disposition: A | Discharge status: Home / Self Care | Payer: Commercial Managed Care - PPO | Source: Ambulatory Visit | Attending: Internal Medicine | Admitting: Internal Medicine

## 2020-07-02 ENCOUNTER — Other Ambulatory Visit: Payer: Self-pay

## 2020-07-02 DIAGNOSIS — Z1231 Encounter for screening mammogram for malignant neoplasm of breast: Secondary | ICD-10-CM | POA: Insufficient documentation

## 2020-09-23 NOTE — Progress Notes (Signed)
Patient ID: Teresa Guerrero, female   DOB: 02-Jul-1965, 55 y.o.   MRN: [id number] Cardiology Office Note  Date:  09/24/2020   ID:  Teresa Guerrero, DOB May 28, 1965, MRN [id number]  PCP:  Perrin Maltese, MD   Chief Complaint  Patient presents with   Other    Patient c.o palpitations. Meds reviewed verbally with patient.     HPI:  Teresa Guerrero is a pleasant 55 year old woman with history of  tachycardia and palpitations dating back to 2012, Event monitor showing SVT, PVCs obesity,  family history of coronary artery disease  Coronary calcium score of 0, In 2017 who presents For new patient evaluation of her SVT, new patient Last seen 2018  Reports that she has been sedentary, Since the beginning of year, started new job, more stress Started cymbalta Mom causes stress, job stress More palpitations, stopping and starting  Does ADLs,  No exercise No chest pain or shortness of breath on exertion  Back on crestor x 1 month, last set of labs off crestor  Restless sleep  EKG personally reviewed by myself on todays visit NSR rate 76 no ST or T wave changes  Previous 30 day monitor  08/2015 2 episodes of SVT rate 180 bpm on 08/13/2015 Symptoms went for approximately 20 minutes, broke on their own  She does report one other episode that she did not have the monitor in place, feels she had 3 episodes over the month  Other past medical history reviewed She reports having tachycardia May 2012, was sent to the hospital for evaluation. Records reviewed from her hospitalization showing normal echocardiogram, normal stress test She was told she had benign tachycardia and was not treated with medication  Went to the emergency room one month ago April 2017 for tachycardia   PMH:   has a past medical history of Allergic rhinitis, Arthritis, Asthma, Climacteric syndrome, Complication of anesthesia, Diverticulosis, Dysrhythmia, GERD (gastroesophageal reflux disease), History of hiatal hernia,  Hypercholesterolemia, IBS (irritable bowel syndrome), Meniere disease, and PONV (postoperative nausea and vomiting).  PSH:    Past Surgical History:  Procedure Laterality Date   ABDOMINAL HYSTERECTOMY  2012   Menorrhagia, No cervix, No ovaries   APPENDECTOMY  1986   BREAST BIOPSY Left 2013   normal results pt had gene test BRACA NEG   CERVICAL FUSION  2009   C5-7   CESAREAN SECTION  1994   CHOLECYSTECTOMY  1980   COLONOSCOPY     2003, 2014   ENDOMETRIAL ABLATION  2009   ESOPHAGEAL DILATION     2003, 2009, 2011   FOOT SURGERY Left 09/01/2015   2nd toe   HEMORRHOID BANDING  2013   KNEE ARTHROSCOPY Right 1983, 1985, 2014   right knee    METATARSAL OSTEOTOMY Left 09/07/2015   Procedure: METATARSAL OSTEOTOMY/2nd met left;  Surgeon: Sharlotte Alamo, DPM;  Location: [facility] ORS;  Service: Podiatry;  Laterality: Left;   TOTAL KNEE ARTHROPLASTY Right 11/12/2017   Procedure: TOTAL KNEE ARTHROPLASTY;  Surgeon: Corky Mull, MD;  Location: [facility] ORS;  Service: Orthopedics;  Laterality: Right;   URETHRAL SLING  2011    Current Outpatient Medications  Medication Sig Dispense Refill   acetaminophen (TYLENOL) 500 MG tablet Take 1,000 mg by mouth 3 (three) times daily.     desloratadine (CLARINEX) 5 MG tablet Take 5 mg by mouth daily as needed (ALLERGIES.).      DULoxetine (CYMBALTA) 30 MG capsule Take 30 mg by mouth daily.     EPIPEN 2-PAK 0.3  MG/0.3ML SOAJ injection Inject 0.3 mg into the muscle daily as needed (FOR ANAPHYLACTIC ALLERGIC REACTIONS).   0   lidocaine-prilocaine (EMLA) cream Apply 1 application topically as needed. Apply every 8 hrs as needed. 30 g 0   Lifitegrast 5 % SOLN Place 1 drop into both eyes 2 (two) times daily.      multivitamin-iron-minerals-folic acid (CENTRUM) chewable tablet Chew 1 tablet by mouth daily.     pantoprazole (PROTONIX) 40 MG tablet Take 1 tablet (40 mg total) by mouth daily. 90 tablet 0   rosuvastatin (CRESTOR) 5 MG tablet Take 5 mg by mouth daily.      spironolactone (ALDACTONE) 25 MG tablet Take 25 mg by mouth daily.     triamcinolone cream (KENALOG) 0.1 % Apply 1 application topically 2 (two) times daily as needed for rash. AFFECTED AREAS OF ARM/BACK  0   sulfamethoxazole-trimethoprim (BACTRIM DS) 800-160 MG tablet Take 1 tablet by mouth 2 (two) times daily. (Patient not taking: Reported on 09/24/2020) 14 tablet 1   No current facility-administered medications for this visit.     Allergies:   Formaldehyde, Kefzol [cefazolin], Penicillin g, Penicillins, Prednisone, Azithromycin, and Ciprofloxacin   Social History:  The patient  reports that she has never smoked. She has never used smokeless tobacco. She reports current alcohol use of about 8.0 standard drinks of alcohol per week. She reports that she does not use drugs.   Family History:   family history includes Breast cancer in her maternal aunt; Colon cancer in her brother; Diabetes in her mother; Heart disease in her father and mother; Hypertension in her father and mother; Stroke in her father; Thyroid disease in her mother.    Review of Systems: Review of Systems  Constitutional: Negative.   HENT: Negative.    Respiratory: Negative.    Cardiovascular:  Positive for palpitations.       Tachycardia  Gastrointestinal: Negative.   Musculoskeletal: Negative.   Neurological: Negative.   Psychiatric/Behavioral: Negative.    All other systems reviewed and are negative.   PHYSICAL EXAM: VS:  BP 112/80 (BP Location: Left Arm, Patient Position: Sitting, Cuff Size: Normal)   Pulse 76   Ht _0  (1.651 m)   Wt 225 lb (102.1 kg)   LMP 03/03/2010 (Exact Date)   SpO2 99%   BMI 37.44 kg/m  , BMI Body mass index is 37.44 kg/m. Constitutional:  oriented to person, place, and time. No distress.  HENT:  Head: Grossly normal Eyes:  no discharge. No scleral icterus.  Neck: No JVD, no carotid bruits  Cardiovascular: Regular rate and rhythm, no murmurs appreciated Pulmonary/Chest: Clear  to auscultation bilaterally, no wheezes or rails Abdominal: Soft.  no distension.  no tenderness.  Musculoskeletal: Normal range of motion Neurological:  normal muscle tone. Coordination normal. No atrophy Skin: Skin warm and dry Psychiatric: normal affect, pleasant  Recent Labs: No results found for requested labs within last 8760 hours.    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    Orders Placed This Encounter  Procedures   LONG TERM MONITOR (3-14 DAYS)   EKG 12-Lead     Signed, Esmond Plants, M.D., Ph.D. 09/24/2020  [place], [place]

## 2020-09-24 ENCOUNTER — Other Ambulatory Visit: Payer: Self-pay

## 2020-09-24 ENCOUNTER — Encounter: Payer: Self-pay | Admitting: Cardiovascular Disease

## 2020-09-24 ENCOUNTER — Ambulatory Visit (INDEPENDENT_AMBULATORY_CARE_PROVIDER_SITE_OTHER): Payer: Commercial Managed Care - PPO

## 2020-09-24 ENCOUNTER — Ambulatory Visit: Payer: Commercial Managed Care - PPO | Admitting: Cardiovascular Disease

## 2020-09-24 VITALS — BP 112/80 | HR 76 | Ht 65.0 in | Wt 225.0 lb

## 2020-09-24 DIAGNOSIS — I471 Supraventricular tachycardia: Secondary | ICD-10-CM

## 2020-09-24 DIAGNOSIS — I493 Ventricular premature depolarization: Secondary | ICD-10-CM | POA: Diagnosis not present

## 2020-09-24 NOTE — Patient Instructions (Addendum)
Medication Instructions:  No changes  If you need a refill on your cardiac medications before your next appointment, please call your pharmacy.    Lab work: No new labs needed   If you have labs (blood work) drawn today and your tests are completely normal, you will receive your results only by: MyChart Message (if you have MyChart) OR A paper copy in the mail If you have any lab test that is abnormal or we need to change your treatment, we will call you to review the results.   Testing/Procedures: - Your physician has recommended that you wear a Zio XT (heart) monitor x 14 days (placed in office today)  This monitor is a medical device that records the heart's electrical activity. Doctors most often use these monitors to diagnose arrhythmias. Arrhythmias are problems with the speed or rhythm of the heartbeat. The monitor is a small device applied to your chest. You can wear one while you do your normal daily activities. While wearing this monitor if you have any symptoms to push the button and record what you felt. Once you have worn this monitor for the period of time provider prescribed (Usually 14 days), you will return the monitor device in the postage paid box. Once it is returned they will download the data collected and provide [place] with a report which the provider will then review and we will call you with those results. Important tips:  Avoid showering during the first 24 hours of wearing the monitor. Avoid excessive sweating to help maximize wear time. Do not submerge the device, no hot tubs, and no swimming pools. Keep any lotions or oils away from the patch. After 24 hours you may shower with the patch on. Take brief showers with your back facing the shower head.  Do not remove patch once it has been placed because that will interrupt data and decrease adhesive wear time. Push the button when you have any symptoms and write down what you were feeling. Once you have completed  wearing your monitor, remove and place into box which has postage paid and place in your outgoing mailbox.  If for some reason you have misplaced your box then call our office and we can provide another box and/or mail it off for you.      Follow-Up: At [facility], you and your health needs are our priority.  As part of our continuing mission to provide you with exceptional heart care, we have created designated Provider Care Teams.  These Care Teams include your primary Cardiologist (physician) and Advanced Practice Providers (APPs -  Physician Assistants and Nurse Practitioners) who all work together to provide you with the care you need, when you need it.  You will need a follow up appointment as needed, we will call with zio results  Providers on your designated Care Team:   Nicolasa Ducking, NP Eula Listen, PA-C Marisue Ivan, PA-C Cadence Fransico Michael, [place]  Any Other Special Instructions Will Be Listed Below (If Applicable).  COVID-19 Vaccine Information can be found at: [email or web address] For questions related to vaccine distribution or appointments, please email [email or web address] or call [phone].

## 2020-11-07 ENCOUNTER — Telehealth: Payer: Self-pay

## 2020-11-07 NOTE — Telephone Encounter (Signed)
Left detail message on VM of pt's recent results okay by DPR, Dr. Mariah Milling advised   "Event monitor  Normal rhythm, very rare short runs of tachycardia longest 26 seconds  On average 1 run every 2 days  If symptomatic could change medications "  At this time, no further recommendations or medications changes, advised to call office for any concerns or questions, otherwise will see at next visit.   Pt has reviewed her results and Dr. Windell Hummingbird recommendation on her MyChart    Append Comments   Seen Back to Top   Event monitor Normal rhythm, very rare short runs of tachycardia longest 26 seconds On average 1 run every 2 days If symptomatic could change medications, just let us know  Written by Antonieta Iba, MD on 11/06/2020 10:23 AM EDT Seen by patient Teresa Guerrero on 11/06/2020  1:44 PM

## 2021-06-19 ENCOUNTER — Other Ambulatory Visit: Payer: Self-pay | Admitting: Internal Medicine

## 2021-06-19 DIAGNOSIS — Z1231 Encounter for screening mammogram for malignant neoplasm of breast: Secondary | ICD-10-CM

## 2021-07-31 ENCOUNTER — Ambulatory Visit
Admission: RE | Admit: 2021-07-31 | Discharge: 2021-07-31 | Disposition: A | Discharge status: Home / Self Care | Payer: Commercial Managed Care - PPO | Source: Ambulatory Visit | Attending: Internal Medicine | Admitting: Internal Medicine

## 2021-07-31 DIAGNOSIS — Z1231 Encounter for screening mammogram for malignant neoplasm of breast: Secondary | ICD-10-CM | POA: Insufficient documentation

## 2022-04-13 ENCOUNTER — Other Ambulatory Visit: Payer: Self-pay | Admitting: Family

## 2022-05-05 ENCOUNTER — Encounter: Payer: Self-pay | Admitting: Internal Medicine

## 2022-05-05 ENCOUNTER — Ambulatory Visit: Payer: Commercial Managed Care - PPO | Admitting: Internal Medicine

## 2022-05-05 VITALS — BP 124/78 | HR 69 | Ht 65.0 in | Wt 233.0 lb

## 2022-05-05 DIAGNOSIS — F411 Generalized anxiety disorder: Secondary | ICD-10-CM

## 2022-05-05 DIAGNOSIS — K582 Mixed irritable bowel syndrome: Secondary | ICD-10-CM

## 2022-05-05 DIAGNOSIS — K449 Diaphragmatic hernia without obstruction or gangrene: Secondary | ICD-10-CM

## 2022-05-05 DIAGNOSIS — E782 Mixed hyperlipidemia: Secondary | ICD-10-CM | POA: Diagnosis not present

## 2022-05-05 DIAGNOSIS — J301 Allergic rhinitis due to pollen: Secondary | ICD-10-CM

## 2022-05-05 MED ORDER — AZELASTINE HCL 0.1 % NA SOLN: 2.0000 | Freq: Every day | NASAL | 12 refills | Status: AC

## 2022-05-05 NOTE — Progress Notes (Signed)
Established Patient Office Visit  Subjective:  Patient ID: Teresa Guerrero, female    DOB: Sep 25, 1965  Age: 56 y.o. MRN: [id number]  Chief Complaint  Patient presents with   Follow-up    Right ear pain    Patient comes in with complaints of right ear pressure and fullness over the last few days.  She denies any sore throat no fever no chills and no body aches.  She does not have a sore throat or postnasal drip.  She gets mild headaches here and there.   No complaints of dizziness, and no nausea or vomiting.  Patient has history of Mnire's disease,for which she uses spironolactone.   On exam her both  tympanic membranes are clear and there is no redness, or fullness. Patient has been using her Flonase nasal spray and Xyzal. Patient advised to change her nasal spray from Flonase to Nasacort.  Will send a prescription for azelastine nasal spray.  And also she will change her antihistamine to Claritin or fexofenadine.     Past Medical History:  Diagnosis Date   Allergic rhinitis    Arthritis    Asthma    Climacteric syndrome    Complication of anesthesia    Diverticulosis    Dysrhythmia    tachycardia/palpitations   GERD (gastroesophageal reflux disease)    History of hiatal hernia    Hypercholesterolemia    IBS (irritable bowel syndrome)    Meniere disease    PONV (postoperative nausea and vomiting)     Social History   Socioeconomic History   Marital status: Married    Spouse name: Not on file   Number of children: Not on file   Years of education: Not on file   Highest education level: Not on file  Occupational History   Not on file  Tobacco Use   Smoking status: Never   Smokeless tobacco: Never  Vaping Use   Vaping Use: Never used  Substance and Sexual Activity   Alcohol use: Yes    Alcohol/week: 8.0 standard drinks of alcohol    Types: 8 Glasses of wine per week   Drug use: No   Sexual activity: Yes    Birth control/protection: Surgical  Other Topics Concern    Not on file  Social History Narrative   Not on file   Social Determinants of Health   Financial Resource Strain: Not on file  Food Insecurity: Not on file  Transportation Needs: Not on file  Physical Activity: Not on file  Stress: Not on file  Social Connections: Not on file  Intimate Partner Violence: Not on file   Past Surgical History:  Procedure Laterality Date   ABDOMINAL HYSTERECTOMY  2012   Menorrhagia, No cervix, No ovaries   APPENDECTOMY  1986   BREAST BIOPSY Left 2013   normal results pt had gene test [facility] NEG   CERVICAL FUSION  2009   C5-7   [place]     2003, 2014   ENDOMETRIAL ABLATION  2009   ESOPHAGEAL DILATION     2003, 2009, 2011   FOOT SURGERY Left 09/01/2015   2nd toe   HEMORRHOID BANDING  2013   KNEE ARTHROSCOPY Right 1983, [place], 2014   right knee    METATARSAL OSTEOTOMY Left 09/07/2015   Procedure: METATARSAL OSTEOTOMY/2nd met left;  Surgeon: Sharlotte Alamo, DPM;  Location: [facility] OR[facility];  Service: Podiatry;  Laterality: Left;   TOTAL KNEE ARTHROPLASTY Right 11/12/2017  Procedure: TOTAL KNEE ARTHROPLASTY;  Surgeon: Corky Mull, MD;  Location: [facility] OR[facility];  Service: Orthopedics;  Laterality: Right;   URETHRAL SLING  2011     Family History  Problem Relation Age of Onset   Diabetes Mother    Thyroid disease Mother    Hypertension Mother    Heart disease Mother    Hypertension Father    Stroke Father    Heart disease Father    Colon cancer Brother    Breast cancer Maternal Aunt     Allergies  Allergen Reactions   Formaldehyde Hives and Rash   Kefzol [Cefazolin] Anaphylaxis and Cough    Other reaction(s): Other (See Comments)   Penicillin G Anaphylaxis    Has patient had a PCN reaction causing immediate rash, facial/tongue/throat swelling, SOB or lightheadedness with hypotension: Yes Has patient had a PCN reaction causing severe rash involving mucus membranes or skin necrosis: No Has patient  had a PCN reaction that required hospitalization No Has patient had a PCN reaction occurring within the last 10 years: Yes If all of the above answers are "NO", then may proceed with Cephalosporin use.    Penicillins Anaphylaxis   Prednisone Anaphylaxis   Azithromycin Rash   Ciprofloxacin Rash    Review of Systems  Constitutional: Negative.   HENT:  Positive for ear pain. Negative for hearing loss.   Eyes: Negative.   Respiratory: Negative.    Cardiovascular: Negative.   Gastrointestinal: Negative.   Genitourinary: Negative.   Musculoskeletal: Negative.   Neurological: Negative.   Psychiatric/Behavioral: Negative.         Objective:   BP 124/78   Pulse 69   Ht '5\' 5"'$  (1.651 m)   Wt 233 lb (105.7 kg)   LMP 03/03/2010 (Exact Date)   SpO2 98%   BMI 38.77 kg/m   Vitals:   05/05/22 1120  BP: 124/78  Pulse: 69  Height: '5\' 5"'$  (1.651 m)  Weight: 233 lb (105.7 kg)  SpO2: 98%  BMI (Calculated): 38.77    Physical Exam Vitals and nursing note reviewed.  Constitutional:      Appearance: Normal appearance. She is obese.  HENT:     Right Ear: Tympanic membrane and ear canal normal.     Left Ear: Tympanic membrane and ear canal normal.     Nose: Nose normal.  Cardiovascular:     Rate and Rhythm: Normal rate and regular rhythm.  Pulmonary:     Effort: Pulmonary effort is normal.     Breath sounds: Normal breath sounds.  Abdominal:     General: Abdomen is flat. Bowel sounds are normal.     Palpations: Abdomen is soft.  Musculoskeletal:        General: Normal range of motion.     Cervical back: Normal range of motion and neck supple.  Neurological:     General: No focal deficit present.     Mental Status: She is alert and oriented to person, place, and time.      No results found for any visits on 05/05/22.  No results found for this or any previous visit (from the past 2160 hour(s)).    Assessment & Plan:   Problem List Items Addressed This Visit      Irritable bowel syndrome (IBS)   Other Visit Diagnoses     Seasonal allergic rhinitis due to pollen    -  Primary   Relevant Medications   azelastine (ASTELIN) 0.1 % nasal spray   Mixed hyperlipidemia  Relevant Medications   rosuvastatin (CRESTOR) 10 MG tablet   GAD (generalized anxiety disorder)       Hiatal hernia           No follow-ups on file.   Total time spent: 20 minutes  Perrin Maltese, MD  05/05/2022

## 2022-08-04 ENCOUNTER — Ambulatory Visit: Payer: Commercial Managed Care - PPO | Admitting: Internal Medicine

## 2022-08-16 ENCOUNTER — Other Ambulatory Visit: Payer: Self-pay | Admitting: Family

## 2022-08-16 DIAGNOSIS — K219 Gastro-esophageal reflux disease without esophagitis: Secondary | ICD-10-CM

## 2023-01-21 ENCOUNTER — Other Ambulatory Visit: Payer: Self-pay | Admitting: Family

## 2023-09-23 ENCOUNTER — Other Ambulatory Visit: Payer: Self-pay | Admitting: Family

## 2023-09-23 DIAGNOSIS — K219 Gastro-esophageal reflux disease without esophagitis: Secondary | ICD-10-CM

## 2024-03-18 ENCOUNTER — Other Ambulatory Visit: Payer: Self-pay | Admitting: Family
